# Patient Record
Sex: Female | Born: 1945
Health system: Southern US, Community
[De-identification: ages and names within clinical notes are randomized; demographics above are authoritative.]

---

## 1999-11-08 ENCOUNTER — Encounter: Payer: Self-pay | Admitting: Internal Medicine

## 1999-11-08 ENCOUNTER — Encounter: Admission: RE | Admit: 1999-11-08 | Discharge: 1999-11-08 | Payer: Self-pay | Admitting: Internal Medicine

## 2001-04-09 ENCOUNTER — Encounter: Payer: Self-pay | Admitting: Internal Medicine

## 2001-04-09 ENCOUNTER — Encounter: Admission: RE | Admit: 2001-04-09 | Discharge: 2001-04-09 | Payer: Self-pay | Admitting: Internal Medicine

## 2001-05-16 ENCOUNTER — Encounter: Payer: Self-pay | Admitting: Internal Medicine

## 2001-05-16 ENCOUNTER — Encounter: Admission: RE | Admit: 2001-05-16 | Discharge: 2001-05-16 | Payer: Self-pay | Admitting: Internal Medicine

## 2002-05-02 ENCOUNTER — Encounter: Admission: RE | Admit: 2002-05-02 | Discharge: 2002-05-02 | Payer: Self-pay | Admitting: Internal Medicine

## 2002-05-02 ENCOUNTER — Encounter: Payer: Self-pay | Admitting: Internal Medicine

## 2002-05-27 ENCOUNTER — Other Ambulatory Visit: Admission: RE | Admit: 2002-05-27 | Discharge: 2002-05-27 | Payer: Self-pay | Admitting: Obstetrics and Gynecology

## 2002-10-02 ENCOUNTER — Ambulatory Visit (HOSPITAL_COMMUNITY): Admission: RE | Admit: 2002-10-02 | Discharge: 2002-10-02 | Payer: Self-pay | Admitting: Internal Medicine

## 2002-10-02 ENCOUNTER — Encounter: Payer: Self-pay | Admitting: Internal Medicine

## 2003-06-08 ENCOUNTER — Inpatient Hospital Stay (HOSPITAL_COMMUNITY): Admission: RE | Admit: 2003-06-08 | Discharge: 2003-06-10 | Payer: Self-pay | Admitting: Obstetrics and Gynecology

## 2003-06-08 ENCOUNTER — Encounter (INDEPENDENT_AMBULATORY_CARE_PROVIDER_SITE_OTHER): Payer: Self-pay | Admitting: Specialist

## 2004-07-21 ENCOUNTER — Encounter: Admission: RE | Admit: 2004-07-21 | Discharge: 2004-07-21 | Payer: Self-pay | Admitting: Obstetrics and Gynecology

## 2006-06-01 ENCOUNTER — Encounter: Admission: RE | Admit: 2006-06-01 | Discharge: 2006-06-01 | Payer: Self-pay | Admitting: Internal Medicine

## 2006-06-14 ENCOUNTER — Encounter: Admission: RE | Admit: 2006-06-14 | Discharge: 2006-06-14 | Payer: Self-pay | Admitting: Internal Medicine

## 2006-06-27 ENCOUNTER — Encounter: Admission: RE | Admit: 2006-06-27 | Discharge: 2006-06-27 | Payer: Self-pay | Admitting: Obstetrics and Gynecology

## 2007-02-19 ENCOUNTER — Encounter: Admission: RE | Admit: 2007-02-19 | Discharge: 2007-02-19 | Payer: Self-pay | Admitting: Rheumatology

## 2007-03-25 ENCOUNTER — Encounter: Admission: RE | Admit: 2007-03-25 | Discharge: 2007-03-25 | Payer: Self-pay | Admitting: Internal Medicine

## 2008-03-25 ENCOUNTER — Encounter: Admission: RE | Admit: 2008-03-25 | Discharge: 2008-03-25 | Payer: Self-pay | Admitting: Internal Medicine

## 2009-03-18 ENCOUNTER — Encounter: Admission: RE | Admit: 2009-03-18 | Discharge: 2009-03-18 | Payer: Self-pay | Admitting: Internal Medicine

## 2009-12-31 ENCOUNTER — Encounter: Admission: RE | Admit: 2009-12-31 | Discharge: 2009-12-31 | Payer: Self-pay | Admitting: Internal Medicine

## 2010-01-01 ENCOUNTER — Inpatient Hospital Stay (HOSPITAL_COMMUNITY): Admission: EM | Admit: 2010-01-01 | Discharge: 2010-01-06 | Payer: Self-pay | Admitting: Emergency Medicine

## 2010-07-21 LAB — CLOSTRIDIUM DIFFICILE EIA: C difficile Toxins A+B, EIA: NEGATIVE

## 2010-07-22 LAB — BASIC METABOLIC PANEL
BUN: 15 mg/dL (ref 6–23)
BUN: 15 mg/dL (ref 6–23)
BUN: 4 mg/dL — ABNORMAL LOW (ref 6–23)
CO2: 25 mEq/L (ref 19–32)
CO2: 25 mEq/L (ref 19–32)
CO2: 26 mEq/L (ref 19–32)
Calcium: 9 mg/dL (ref 8.4–10.5)
Calcium: 9.3 mg/dL (ref 8.4–10.5)
Calcium: 9.7 mg/dL (ref 8.4–10.5)
Chloride: 100 mEq/L (ref 96–112)
Chloride: 112 mEq/L (ref 96–112)
Chloride: 96 mEq/L (ref 96–112)
Creatinine, Ser: 0.79 mg/dL (ref 0.4–1.2)
Creatinine, Ser: 0.96 mg/dL (ref 0.4–1.2)
Creatinine, Ser: 0.96 mg/dL (ref 0.4–1.2)
GFR calc Af Amer: >60 mL/min (ref >60)
GFR calc Af Amer: >60 mL/min (ref >60)
GFR calc Af Amer: >60 mL/min (ref >60)
GFR calc non Af Amer: 59 mL/min — ABNORMAL LOW (ref >60)
GFR calc non Af Amer: 59 mL/min — ABNORMAL LOW (ref >60)
GFR calc non Af Amer: >60 mL/min (ref >60)
Glucose, Bld: 106 mg/dL — ABNORMAL HIGH (ref 70–99)
Glucose, Bld: 112 mg/dL — ABNORMAL HIGH (ref 70–99)
Glucose, Bld: 120 mg/dL — ABNORMAL HIGH (ref 70–99)
Potassium: 3.3 mEq/L — ABNORMAL LOW (ref 3.5–5.1)
Potassium: 3.8 mEq/L (ref 3.5–5.1)
Potassium: 4.3 mEq/L (ref 3.5–5.1)
Sodium: 130 mEq/L — ABNORMAL LOW (ref 135–145)
Sodium: 136 mEq/L (ref 135–145)
Sodium: 143 mEq/L (ref 135–145)

## 2010-07-22 LAB — CBC
HCT: 33.8 % — ABNORMAL LOW (ref 36.0–46.0)
HCT: 35.4 % — ABNORMAL LOW (ref 36.0–46.0)
HCT: 38 % (ref 36.0–46.0)
HCT: 39.3 % (ref 36.0–46.0)
Hemoglobin: 11.5 g/dL — ABNORMAL LOW (ref 12.0–15.0)
Hemoglobin: 12.1 g/dL (ref 12.0–15.0)
Hemoglobin: 13.5 g/dL (ref 12.0–15.0)
Hemoglobin: 14.2 g/dL (ref 12.0–15.0)
MCH: 30.4 pg (ref 26.0–34.0)
MCH: 31.1 pg (ref 26.0–34.0)
MCH: 31.4 pg (ref 26.0–34.0)
MCH: 32.5 pg (ref 26.0–34.0)
MCHC: 34 g/dL (ref 30.0–36.0)
MCHC: 34.2 g/dL (ref 30.0–36.0)
MCHC: 35.5 g/dL (ref 30.0–36.0)
MCHC: 36.1 g/dL — ABNORMAL HIGH (ref 30.0–36.0)
MCV: 88.4 fL (ref 78.0–100.0)
MCV: 89.4 fL (ref 78.0–100.0)
MCV: 89.9 fL (ref 78.0–100.0)
MCV: 91 fL (ref 78.0–100.0)
Platelets: 263 10*3/uL (ref 150–400)
Platelets: 279 10*3/uL (ref 150–400)
Platelets: 351 10*3/uL (ref 150–400)
Platelets: 361 10*3/uL (ref 150–400)
RBC: 3.78 MIL/uL — ABNORMAL LOW (ref 3.87–5.11)
RBC: 3.89 MIL/uL (ref 3.87–5.11)
RBC: 4.3 MIL/uL (ref 3.87–5.11)
RBC: 4.37 MIL/uL (ref 3.87–5.11)
RDW: 12.4 % (ref 11.5–15.5)
RDW: 12.4 % (ref 11.5–15.5)
RDW: 12.4 % (ref 11.5–15.5)
RDW: 12.5 % (ref 11.5–15.5)
WBC: 12.2 10*3/uL — ABNORMAL HIGH (ref 4.0–10.5)
WBC: 13.5 10*3/uL — ABNORMAL HIGH (ref 4.0–10.5)
WBC: 8.1 10*3/uL (ref 4.0–10.5)
WBC: 8.3 10*3/uL (ref 4.0–10.5)

## 2010-07-22 LAB — CLOSTRIDIUM DIFFICILE EIA
C difficile Toxins A+B, EIA: NEGATIVE
C difficile Toxins A+B, EIA: NEGATIVE

## 2010-07-22 LAB — PROTIME-INR
INR: 1.02 (ref 0.00–1.49)
Prothrombin Time: 13.6 seconds (ref 11.6–15.2)

## 2010-07-22 LAB — COMPREHENSIVE METABOLIC PANEL
ALT: 24 U/L (ref 0–35)
AST: 30 U/L (ref 0–37)
Albumin: 2.9 g/dL — ABNORMAL LOW (ref 3.5–5.2)
Alkaline Phosphatase: 53 U/L (ref 39–117)
BUN: 2 mg/dL — ABNORMAL LOW (ref 6–23)
CO2: 24 mEq/L (ref 19–32)
Calcium: 8.5 mg/dL (ref 8.4–10.5)
Chloride: 113 mEq/L — ABNORMAL HIGH (ref 96–112)
Creatinine, Ser: 0.82 mg/dL (ref 0.4–1.2)
GFR calc Af Amer: >60 mL/min (ref >60)
GFR calc non Af Amer: >60 mL/min (ref >60)
Glucose, Bld: 100 mg/dL — ABNORMAL HIGH (ref 70–99)
Potassium: 4.5 mEq/L (ref 3.5–5.1)
Sodium: 140 mEq/L (ref 135–145)
Total Bilirubin: 0.7 mg/dL (ref 0.3–1.2)
Total Protein: 6 g/dL (ref 6.0–8.3)

## 2010-07-22 LAB — BODY FLUID CULTURE
Culture: NO GROWTH
Gram Stain: NONE SEEN

## 2010-07-22 LAB — DIFFERENTIAL
Basophils Absolute: 0.1 10*3/uL (ref 0.0–0.1)
Basophils Absolute: 0.1 10*3/uL (ref 0.0–0.1)
Basophils Relative: 0 % (ref 0–1)
Basophils Relative: 0 % (ref 0–1)
Eosinophils Absolute: 0 10*3/uL (ref 0.0–0.7)
Eosinophils Absolute: 0.1 10*3/uL (ref 0.0–0.7)
Eosinophils Relative: 0 % (ref 0–5)
Eosinophils Relative: 1 % (ref 0–5)
Lymphocytes Relative: 19 % (ref 12–46)
Lymphocytes Relative: 30 % (ref 12–46)
Lymphs Abs: 2.3 10*3/uL (ref 0.7–4.0)
Lymphs Abs: 4 10*3/uL (ref 0.7–4.0)
Monocytes Absolute: 0.9 10*3/uL (ref 0.1–1.0)
Monocytes Absolute: 1.1 10*3/uL — ABNORMAL HIGH (ref 0.1–1.0)
Monocytes Relative: 8 % (ref 3–12)
Monocytes Relative: 8 % (ref 3–12)
Neutro Abs: 8.2 10*3/uL — ABNORMAL HIGH (ref 1.7–7.7)
Neutro Abs: 8.9 10*3/uL — ABNORMAL HIGH (ref 1.7–7.7)
Neutrophils Relative %: 61 % (ref 43–77)
Neutrophils Relative %: 73 % (ref 43–77)

## 2010-09-23 NOTE — Discharge Summary (Signed)
NAMEELARA, COCKE                      ACCOUNT NO.:  [id number]   MEDICAL RECORD NO.:  [id number]                   PATIENT TYPE:  INP   LOCATION:  9306                                 FACILITY:  [facility]   PHYSICIAN:  Daniel L. Eda Paschal, M.D.           DATE OF BIRTH:  1945/12/20   DATE OF ADMISSION:  06/08/2003  DATE OF DISCHARGE:  06/10/2003                                 DISCHARGE SUMMARY   DISCHARGE DIAGNOSES:  1. Symptomatic uterine prolapse.  2. Cystocele.  3. Rectocele.  4. Dehiscence with vaginal hysterectomy, bilateral salpingo-oophorectomy,     anterior and posterior repair by Dr. Edyth Gunnels on June 08, 2003.   HISTORY OF PRESENT ILLNESS:  This 65 year old female gravida 2, para 2-0-1-  2, who had a 49-month history of increasing symptomatic pelvic relaxation  continues to feel pressure in her bladder.  She was found to have a third  degree cystocele, second degree rectocele and an almost third degree uterine  descensus.  Had been evaluated by Dr. Jonny Ruiz ___________ who did not feel a  sling procedure was necessary, so she entered the hospital for definitive  surgery.   HOSPITAL COURSE:  On June 08, 2003, the patient was admitted, underwent a  vaginal hysterectomy and bilateral salpingo-oophorectomy, anterior and  posterior repair by Dr. Edyth Gunnels without complications.  The patient  remained afebrile on postoperative day #1 on June 09, 2003.  Pack was  removed.  On June 10, 2003, Foley was removed.  She remained otherwise in  satisfactory condition.  On June 10, 2003, at 14:00, the patient has been  unable to void x2.  Foley had been replaced.  The patient was otherwise, in  satisfactory and stable condition, so she was discharged to home.   LABORATORY DATA:  Preoperatively on June 04, 2003, hemoglobin 12.4.  On  June 10, 2003, urine showed 20,000 enterococcus.   DISPOSITION:  The patient is discharged to home.  She was to  follow up on  Friday for Foley catheter removal.  She was discharged on Cipro 500 mg  b.i.d. and Vicodin p.r.n. pain.     Susa Loffler, P.A.                    Daniel L. Eda Paschal, M.D.    Ardath Sax  D:  07/13/2003  T:  07/13/2003  Job:  [id number]

## 2010-09-23 NOTE — H&P (Signed)
NAMESHIASIA, Sandra [name]                      ACCOUNT NO.:  [id number]   MEDICAL RECORD NO.:  [id number]                   PATIENT TYPE:  INP   LOCATION:  NA                                   [facility]  [facility]   PHYSICIAN:  Daniel L. Eda Paschal, M.D.           DATE OF BIRTH:  01/05/1946   DATE OF ADMISSION:  DATE OF DISCHARGE:                                HISTORY & PHYSICAL   CHIEF COMPLAINT:  Symptomatic pelvic relaxation.   HISTORY OF PRESENT ILLNESS:  The patient is a 65 year old gravida 3, para 2-  0-1-2, who comes into the hospital  after a 6 month history of increasing  symptomatic pelvic relaxation. The patient continuously  feels pressure on  her bladder. She also has a bulge which is always with her. It is difficult  to stand on her  feet for any period of time because of all this pressure. A  pelvic examination  reveals a 3rd degree cystocele, 2nd degree rectocele and  almost 3rd degree uterine descensus. She has been evaluated by Dr. Excell Seltzer.  Wrenn who did not feel a sling procedure was necessary, so she now enters  the hospital  for a vaginal hysterectomy and anterior and posterior  repair.  We have discussed the pros and cons of removing her ovaries, and she would  like them removed, even if it requires laparoscopy or laparotomy to do so,  so she will also undergo a salpingo oophorectomy bilaterally.   PAST MEDICAL HISTORY:  1. Gastroesophageal reflux disease.  2. Polymyalgia rheumatica.  3. Hypertension.  4. Previous surgery is a tubal ligation.   MEDICATIONS:  Lotensin, Prilosec and steroids for her polymyalgia  rheumatica.   FAMILY HISTORY:  Her brothers and sisters are hypertensive. Otherwise  her  family history is noncontributory.   SOCIAL HISTORY:  She is a nonsmoker, nondrinker.   REVIEW OF SYSTEMS:  HEENT:  Negative. CARDIAC:  Hypertension. RESPIRATORY:  Negative. GI:  GERD. MUSCULOSKELETAL:  Polymyalgia rheumatica. GU:  Large  cystocele. NEUROLOGIC,  PSYCHIATRIC:  Negative. ALLERGIC, IMMUNE, LYMPHATIC  AND  ENDOCRINE:  All negative.   PHYSICAL EXAMINATION:  GENERAL:  The patient is a well developed, well  nourished female in no acute distress.  VITAL SIGNS:  Blood pressure 140/90, pulse is 80 and regular, respirations  16 and nonlabored, afebrile.  HEENT:  Well within normal limits.  NECK:  Supple. Trachea is in the midline. Thyroid is  not enlarged.  LUNGS:  Clear percussion and auscultation.  HEART:  No thrills, heaves or murmurs.  BREASTS:  No masses.  ABDOMEN:  Soft without rebound or guarding or masses.  PELVIC:  External is normal, BUS is normal. Vaginal examination reveals 3rd  degree cystocele, 2nd degree rectocele. Cervix is clean. Uterus is  retroverted. Top normal size and shape with almost 3rd degree uterine  descensus. Adnexa failed to reveal masses.  RECTAL:  Negative.  EXTREMITIES:  Within normal limits.  IMPRESSION:  Symptomatic uterine prolapse, cystocele and rectocele.   PLAN:  Definitive surgery, see above.                                               Daniel L. Eda Paschal, M.D.    Tonette Bihari  D:  06/07/2003  T:  06/07/2003  Job:  [id number]

## 2010-09-23 NOTE — Op Note (Signed)
Sandra [name], ORMAND                      ACCOUNT NO.:  [id number]   MEDICAL RECORD NO.:  [id number]                   PATIENT TYPE:  INP   LOCATION:  9399                                 FACILITY:  [facility]   PHYSICIAN:  Daniel L. Eda Paschal, M.D.           DATE OF BIRTH:  03/10/1946   DATE OF PROCEDURE:  06/08/2003  DATE OF DISCHARGE:                                 OPERATIVE REPORT   PREOPERATIVE DIAGNOSES:  1. Symptomatic uterine prolapse.  2. Cystocele.  3. Rectocele.   POSTOPERATIVE DIAGNOSES:  1. Symptomatic uterine prolapse.  2. Cystocele.  3. Rectocele.   PROCEDURE:  Vaginal hysterectomy, bilateral salpingo-oophorectomy, anterior  and posterior repair.   SURGEON:  Daniel L. Eda Paschal, M.D.   FIRST ASSISTANT:  Timothy P. Fontaine, M.D.   FINDINGS:  The patient had almost third degree uterine descensus.  She had a  third degree cystocele and a second degree rectocele.  Ovaries, fallopian  tubes and pelvic peritoneum were free of disease.  Uterus itself was normal.   DESCRIPTION OF PROCEDURE:  After adequate general anesthesia, the patient  was placed in the dorsal lithotomy position, prepped and draped in the usual  sterile manner.  A 100 to 200,000 solution of epinephrine and 0.5% Xylocaine  was injected around the cervix.  A 360 degree incision was made around the  cervix.  The bladder was mobilized superiorly as was the posterior  peritoneum.  The posterior peritoneum and vesicouterine fold of peritoneum  were entered with sharp dissection.  The uterosacral ligaments were clamped.  In clamping them, they were shortened and they were sutured to the vault  laterally with good vault support.  Cardinal ligaments, uterine arteries,  and the balance of the broad ligament were clamped, cut, and suture ligated.  The ovaries and tubes were delivered and both IP ligaments were clamped,  cut, and suture ligated.  Suture material for all of the above mentioned  pedicles was  #1 chromic catgut.  All major vesicle bundles were doubly  ligated.  Uterus, ovaries and tubes were sent to pathology for tissue  diagnosis.  The vaginal cuff was whip stitched with a running locking 0  Vicryl.  A modified McCall enterocele suture was placed with 2-0 Vicryl.  At  this point, the cuff was left open and the anterior repair was done.  The  anterior vaginal mucosa was undermined up to the urethrovesical angle and  the cystocele and the vesicle vaginal fascia was sharply dissected free from  the mucosa.  A Foley catheter was inserted and then the cystocele was  eliminated with about eight sutures of 2-0 Vicryl picking up vesical vaginal  fascia.  Redundant patch of mucosa was trimmed away and then the anterior  vaginal mucosa was closed with a running locking 2-0 Vicryl, also picking up  the fascia underneath for better support as well as to eliminate dead space.  After this, the cuff was closed with figure-of-eights  and #1 chromic catgut.  The modified McCall enterocele sutures were tied in place and then attention  was turned to the posterior repair. The posterior vaginal mucosa was  undermined to the top of the cuff.  The perirectal fascia and the rectocele  were sharply dissected free.  The rectocele was then reduced with  interrupted 2-0 Vicryl picking up perirectal fascia.  Redundant vaginal  mucosa was trimmed away and  then the vaginal mucosa was closed with a running locking 2-0 Vicryl.  The  vagina was packed with one inch Iodoform.  The estimated blood loss for the  entire procedure was 300 mL with none replaced.  The patient tolerated the  procedure well and left the operating room in satisfactory condition  draining clear urine from her Foley catheter.                                               Daniel L. Eda Paschal, M.D.    Tonette Bihari  D:  06/08/2003  T:  06/08/2003  Job:  [id number]

## 2011-08-14 ENCOUNTER — Other Ambulatory Visit: Payer: Self-pay | Admitting: Internal Medicine

## 2011-08-14 DIAGNOSIS — Z1231 Encounter for screening mammogram for malignant neoplasm of breast: Secondary | ICD-10-CM

## 2011-08-24 ENCOUNTER — Ambulatory Visit
Admission: RE | Admit: 2011-08-24 | Discharge: 2011-08-24 | Disposition: A | Discharge status: Home / Self Care | Payer: BC Managed Care – PPO | Source: Ambulatory Visit | Attending: Internal Medicine | Admitting: Internal Medicine

## 2011-08-24 DIAGNOSIS — Z1231 Encounter for screening mammogram for malignant neoplasm of breast: Secondary | ICD-10-CM

## 2011-11-08 ENCOUNTER — Encounter: Payer: Self-pay | Admitting: Sports Medicine

## 2011-11-08 ENCOUNTER — Ambulatory Visit (INDEPENDENT_AMBULATORY_CARE_PROVIDER_SITE_OTHER): Payer: BC Managed Care – PPO | Admitting: Sports Medicine

## 2011-11-08 VITALS — BP 130/76 | HR 68 | Ht 62.0 in | Wt 160.0 lb

## 2011-11-08 DIAGNOSIS — M25569 Pain in unspecified knee: Secondary | ICD-10-CM | POA: Insufficient documentation

## 2011-11-08 NOTE — Progress Notes (Signed)
  Subjective:    Patient ID: Sandra [name], female    DOB: [birth year], 66 y.o.   MRN: [id number]  HPI Patient is a very pleasant 66 year old female comes in today complaining of 1 month of left knee pain. Symptoms began while walking. She felt the knee "shift" followed by pain and swelling. She saw her primary care physician who ordered x-rays of her knee and placed her on Indocin but despite that her symptoms persist. She describes a sharp pain along the medial aspect of the knee, at times radiating into the os tear aspect of the knee. Pain is worse with weightbearing and walking (she is an avid recreational walker). No mechanical symptoms. Symptoms resolved at rest. No problems with her knee in the past, no prior knee surgeries. She is seen today at the request of Dr. Kevan Ny for further evaluation and treatment.   Review of Systems     Objective:   Physical Exam  Well-developed and well-nourished, in no acute distress. Is elevation of the left knee shows a 5 extension lag with flexion to about 110. She is tender to palpation along the medial joint line, negative McMurray's, positive thesalys. Knee is stable to valgus and varus stressing negative anterior drawer negative posterior drawer. Trace patellofemoral crepitus with active extension. She is neurovascularly intact distally and walking with a slight limp.  X-rays from Dr. Kevan Ny his office are unfortunately not available for review. X-rays from October of 2008 are reviewed and they show a paucity of degenerative changes. Please note that these films are nonweightbearing.       Assessment & Plan:  #1. Left knee pain likely secondary to degenerative medial meniscal tear  Patient's knee is injected in the office today. He was draped and prepped in a sterile fashion. Injected with a combination of 1 cc of Depo-Medrol/3 cc of Marcaine. This was accomplished using an anterior lateral approach after risks and benefits were explained. The  patient tolerated this without any difficulty. She'll try a body she leaks knee sleeve and given her home exercise program. She can continue her walking program as long as pain is less than 3/10 and she is not limping. She'll followup in 3 weeks at which time a basket she bring her most recent x-rays. If symptoms persist a followup we will likely need to consider merits of further diagnostic imaging.

## 2011-11-22 ENCOUNTER — Other Ambulatory Visit: Payer: Self-pay | Admitting: Orthopaedic Surgery

## 2011-11-22 DIAGNOSIS — M25562 Pain in left knee: Secondary | ICD-10-CM

## 2011-11-30 ENCOUNTER — Ambulatory Visit: Payer: BC Managed Care – PPO | Admitting: Sports Medicine

## 2011-12-01 ENCOUNTER — Ambulatory Visit
Admission: RE | Admit: 2011-12-01 | Discharge: 2011-12-01 | Disposition: A | Discharge status: Home / Self Care | Payer: BC Managed Care – PPO | Source: Ambulatory Visit | Attending: Orthopaedic Surgery | Admitting: Orthopaedic Surgery

## 2011-12-01 ENCOUNTER — Ambulatory Visit: Payer: BC Managed Care – PPO | Admitting: Sports Medicine

## 2011-12-01 DIAGNOSIS — M25562 Pain in left knee: Secondary | ICD-10-CM

## 2013-03-14 ENCOUNTER — Other Ambulatory Visit: Payer: Self-pay

## 2013-03-14 DIAGNOSIS — Z1231 Encounter for screening mammogram for malignant neoplasm of breast: Secondary | ICD-10-CM

## 2013-04-11 ENCOUNTER — Ambulatory Visit
Admission: RE | Admit: 2013-04-11 | Discharge: 2013-04-11 | Disposition: A | Discharge status: Home / Self Care | Payer: BC Managed Care – PPO | Source: Ambulatory Visit

## 2013-04-11 DIAGNOSIS — Z1231 Encounter for screening mammogram for malignant neoplasm of breast: Secondary | ICD-10-CM

## 2014-03-06 ENCOUNTER — Other Ambulatory Visit: Payer: Self-pay | Admitting: Gastroenterology

## 2014-07-20 ENCOUNTER — Other Ambulatory Visit: Payer: Self-pay | Admitting: Internal Medicine

## 2014-07-20 DIAGNOSIS — K5732 Diverticulitis of large intestine without perforation or abscess without bleeding: Secondary | ICD-10-CM

## 2014-07-21 ENCOUNTER — Ambulatory Visit
Admission: RE | Admit: 2014-07-21 | Discharge: 2014-07-21 | Disposition: A | Discharge status: Home / Self Care | Payer: BC Managed Care – PPO | Source: Ambulatory Visit | Attending: Internal Medicine | Admitting: Internal Medicine

## 2014-07-21 ENCOUNTER — Other Ambulatory Visit: Payer: BC Managed Care – PPO

## 2014-07-21 DIAGNOSIS — K5732 Diverticulitis of large intestine without perforation or abscess without bleeding: Secondary | ICD-10-CM

## 2014-07-21 MED ORDER — IOPAMIDOL (ISOVUE-300) INJECTION 61%
100.0000 mL | Freq: Once | INTRAVENOUS | Status: AC | PRN
  Administered 2014-07-21: 100 mL via INTRAVENOUS

## 2015-05-17 ENCOUNTER — Other Ambulatory Visit: Payer: Self-pay

## 2015-05-17 DIAGNOSIS — Z1231 Encounter for screening mammogram for malignant neoplasm of breast: Secondary | ICD-10-CM

## 2015-06-07 ENCOUNTER — Ambulatory Visit
Admission: RE | Admit: 2015-06-07 | Discharge: 2015-06-07 | Disposition: A | Discharge status: Home / Self Care | Payer: Medicare Other | Source: Ambulatory Visit

## 2015-06-07 DIAGNOSIS — Z1231 Encounter for screening mammogram for malignant neoplasm of breast: Secondary | ICD-10-CM

## 2015-12-15 ENCOUNTER — Ambulatory Visit (INDEPENDENT_AMBULATORY_CARE_PROVIDER_SITE_OTHER): Payer: Medicare Other | Admitting: Internal Medicine

## 2015-12-15 ENCOUNTER — Encounter: Payer: Self-pay | Admitting: Internal Medicine

## 2015-12-15 DIAGNOSIS — K219 Gastro-esophageal reflux disease without esophagitis: Secondary | ICD-10-CM | POA: Insufficient documentation

## 2015-12-15 DIAGNOSIS — M542 Cervicalgia: Secondary | ICD-10-CM | POA: Insufficient documentation

## 2015-12-15 DIAGNOSIS — K5732 Diverticulitis of large intestine without perforation or abscess without bleeding: Secondary | ICD-10-CM | POA: Insufficient documentation

## 2015-12-15 DIAGNOSIS — I10 Essential (primary) hypertension: Secondary | ICD-10-CM | POA: Insufficient documentation

## 2015-12-15 DIAGNOSIS — E1165 Type 2 diabetes mellitus with hyperglycemia: Secondary | ICD-10-CM | POA: Diagnosis not present

## 2015-12-15 DIAGNOSIS — E538 Deficiency of other specified B group vitamins: Secondary | ICD-10-CM | POA: Insufficient documentation

## 2015-12-15 DIAGNOSIS — N63 Unspecified lump in unspecified breast: Secondary | ICD-10-CM | POA: Insufficient documentation

## 2015-12-15 LAB — POCT GLYCOSYLATED HEMOGLOBIN (HGB A1C): Hemoglobin A1C: 7.7

## 2015-12-15 MED ORDER — METFORMIN HCL ER 500 MG PO TB24: 500.0000 mg | ORAL_TABLET | Freq: Every day | ORAL | 3 refills | Status: DC

## 2015-12-15 NOTE — Patient Instructions (Addendum)
Please continue Januvia 100 mg but move it to before b'fast.  Please start Metformin ER 500 mg daily with dinner.  Please let me know if the sugars are consistently <80 or >200.  PATIENT INSTRUCTIONS FOR TYPE 2 DIABETES:  **Please join MyChart!** - see attached instructions about how to join if you have not done so already.  DIET AND EXERCISE Diet and exercise is an important part of diabetic treatment.  We recommended aerobic exercise in the form of brisk walking (working between 40-60% of maximal aerobic capacity, similar to brisk walking) for 150 minutes per week (such as 30 minutes five days per week) along with 3 times per week performing 'resistance' training (using various gauge rubber tubes with handles) 5-10 exercises involving the major muscle groups (upper body, lower body and core) performing 10-15 repetitions (or near fatigue) each exercise. Start at half the above goal but build slowly to reach the above goals. If limited by weight, joint pain, or disability, we recommend daily walking in a swimming pool with water up to waist to reduce pressure from joints while allow for adequate exercise.    BLOOD GLUCOSES Monitoring your blood glucoses is important for continued management of your diabetes. Please check your blood glucoses 2-4 times a day: fasting, before meals and at bedtime (you can rotate these measurements - e.g. one day check before the 3 meals, the next day check before 2 of the meals and before bedtime, etc.).   HYPOGLYCEMIA (low blood sugar) Hypoglycemia is usually a reaction to not eating, exercising, or taking too much insulin/ other diabetes drugs.  Symptoms include tremors, sweating, hunger, confusion, headache, etc. Treat IMMEDIATELY with 15 grams of Carbs: . 4 glucose tablets .  cup regular juice/soda . 2 tablespoons raisins . 4 teaspoons sugar . 1 tablespoon honey Recheck blood glucose in 15 mins and repeat above if still symptomatic/blood glucose  <100.  RECOMMENDATIONS TO REDUCE YOUR RISK OF DIABETIC COMPLICATIONS: * Take your prescribed MEDICATION(S) * Follow a DIABETIC diet: Complex carbs, fiber rich foods, (monounsaturated and polyunsaturated) fats * AVOID saturated/trans fats, high fat foods, >2,300 mg salt per day. * EXERCISE at least 5 times a week for 30 minutes or preferably daily.  * DO NOT SMOKE OR DRINK more than 1 drink a day. * Check your FEET every day. Do not wear tightfitting shoes. Contact us if you develop an ulcer * See your EYE doctor once a year or more if needed * Get a FLU shot once a year * Get a PNEUMONIA vaccine once before and once after age 565 years  GOALS:  * Your Hemoglobin A1c of <7%  * fasting sugars need to be <130 * after meals sugars need to be <180 (2h after you start eating) * Your Systolic BP should be 140 or lower  * Your Diastolic BP should be 80 or lower  * Your HDL (Good Cholesterol) should be 40 or higher  * Your LDL (Bad Cholesterol) should be 100 or lower. * Your Triglycerides should be 150 or lower  * Your Urine microalbumin (kidney function) should be <30 * Your Body Mass Index should be 25 or lower    Please consider the following ways to cut down carbs and fat and increase fiber and micronutrients in your diet: - substitute whole grain for white bread or pasta - substitute brown rice for white rice - substitute 90-calorie flat bread pieces for slices of bread when possible - substitute sweet potatoes or yams for white  potatoes - substitute humus for margarine - substitute tofu for cheese when possible - substitute almond or rice milk for regular milk (would not drink soy milk daily due to concern for soy estrogen influence on breast cancer risk) - substitute dark chocolate for other sweets when possible - substitute water - can add lemon or orange slices for taste - for diet sodas (artificial sweeteners will trick your body that you can eat sweets without getting calories and  will lead you to overeating and weight gain in the long run) - do not skip breakfast or other meals (this will slow down the metabolism and will result in more weight gain over time)  - can try smoothies made from fruit and almond/rice milk in am instead of regular breakfast - can also try old-fashioned (not instant) oatmeal made with almond/rice milk in am - order the dressing on the side when eating salad at a restaurant (pour less than half of the dressing on the salad) - eat as little meat as possible - can try juicing, but should not forget that juicing will get rid of the fiber, so would alternate with eating raw veg./fruits or drinking smoothies - use as little oil as possible, even when using olive oil - can dress a salad with a mix of balsamic vinegar and lemon juice, for e.g. - use agave nectar, stevia sugar, or regular sugar rather than artificial sweateners - steam or broil/roast veggies  - snack on veggies/fruit/nuts (unsalted, preferably) when possible, rather than processed foods - reduce or eliminate aspartame in diet (it is in diet sodas, chewing gum, etc) Read the labels!  Try to read Dr. Katherina Right book: "Program for Reversing Diabetes" for other ideas for healthy eating.

## 2015-12-15 NOTE — Progress Notes (Signed)
Patient ID: Sandra [name], female   DOB: Nov 06, 1945, 70 y.o.   MRN: [id number]   HPI: Sandra [name] is a 70 y.o.-year-old female, referred by her PCP, Dr. Kevan Ny, for management of DM2, dx in 2015, prev. Prediabetes, non-insulin-dependent, uncontrolled, without complications.  Last hemoglobin A1c was: 10/2015: HbA1c 8.2%  Pt is on a regimen of: - Januvia 100 mg daily before dinner - started 11/2015 She was on Metformin 500 mg 1x a day with dinner (500 mg tid >> AP/diarrhea)  >> stopped when started Januvia  Pt checks her sugars 0-1x a day and they are: - am: n/c  - 2h after b'fast: n/c - before lunch: n/c - 2h after lunch: 180s - before dinner: n/c - 2h after dinner: n/c - bedtime: n/c - nighttime: n/c No lows; ? hypoglycemia awareness.  Glucometer: OneTouch  Pt's meals are (she started to cut down sugars): - Breakfast: - Lunch: - Dinner:  - Snacks:  She started to walk 3x a week, 2-3 miles a day.  - no CKD, last BUN/creatinine:  10/22/2015: 21/0.79, ACR 35.6 Lab Results  Component Value Date   BUN 2 (L) 01/04/2010   BUN 4 (L) 01/03/2010   CREATININE 0.82 01/04/2010   CREATININE 0.79 01/03/2010   - last set of lipids: 10/22/2015: 226/201/48/138 No results found for: CHOL, HDL, LDLCALC, LDLDIRECT, TRIG, CHOLHDL - last eye exam was in 04/2015. No DR.  - no numbness and tingling in her feet. Last foot exam was normal on 10/22/2015.  Pt has FH of DM in 5 siblings.  ROS: Constitutional: + weight gain, no fatigue, no subjective hyperthermia/hypothermia Eyes: no blurry vision, no xerophthalmia ENT: no sore throat, no nodules palpated in throat, no dysphagia/odynophagia, no hoarseness, + decreased hearing Cardiovascular: no CP/SOB/palpitations/leg swelling Respiratory: no cough/SOB Gastrointestinal: no N/V/D/C/+ heartburn Musculoskeletal: no muscle/joint aches Skin: no rashes Neurological: no tremors/numbness/tingling/dizziness Psychiatric: no  depression/anxiety  Past medical history: Patient Active Problem List   Diagnosis Date Noted  . 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 Cervical pain 12/15/2015  . Diverticulitis of colon 12/15/2015  . Acid reflux 12/15/2015  . Essential (primary) hypertension 12/15/2015  . Benign breast lumps 12/15/2015  . Type 2 diabetes mellitus with hyperglycemia (HCC) 12/15/2015  . Knee pain 11/08/2011   No past surgical history on file.   Social History   Social History  . Marital status: Married    Spouse name: N/A  . Number of children: 2   Occupational History  . Retired    Social History Main Topics  . Smoking status: Never Smoker  . Smokeless tobacco: Never Used  . Alcohol use Socially   . Drug use: No     Dose, Frequency                ONETOUCH VERIO test strip             Note (12/15/2015): Received from: External Pharmacy         [facility] LANCETS 33G MISC             Note (12/15/2015): Received from: External Pharmacy         omeprazole (PRILOSEC) 20 MG capsule 20 mg, Daily       Summary: Take 20 mg by mouth daily.     MICARDIS HCT 80-25 MG per tablet 1 tablet, Daily       Summary: Take 1 tablet by mouth daily.     JANUVIA 100 MG tablet  Note (12/15/2015): Received from: External Pharmacy           Allergies  Allergen Reactions  . Sulfa Antibiotics Rash   No family history on file.  PE: BP (!) 144/90 (BP Location: Left Arm, Patient Position: Sitting)   Pulse 75   Ht 5' 2.5" (1.588 m)   Wt 152 lb (68.9 kg)   BMI 27.36 kg/m  Wt Readings from Last 3 Encounters:  12/15/15 152 lb (68.9 kg)  11/08/11 160 lb (72.6 kg)   Constitutional: Slightly overweight, in NAD Eyes: PERRLA, EOMI, no exophthalmos ENT: moist mucous membranes, no thyromegaly, no cervical lymphadenopathy Cardiovascular: RRR, No MRG Respiratory: CTA B Gastrointestinal: abdomen soft, NT, ND, BS+ Musculoskeletal: no deformities, strength intact in all 4 Skin:  moist, warm, no rashes Neurological: no tremor with outstretched hands, DTR normal in all 4  ASSESSMENT: 1. DM2, non-insulin-dependent, uncontrolled, without long term complications, but with hyperglycemia  PLAN:  1. Patient with long-standing, uncontrolled diabetes, on oral antidiabetic regimen, with Januvia only, which is taken incorrectly, before dinner. Misunderstood to stop the metformin when she started Januvia. She could not tolerate 500 mg 3 times a day of metformin, but did well with 500 mg with dinner. - At this visit, I suggested to continue Januvia but move it before breakfast and also add 500 mg of metformin extended-release with dinner -We rechecked her HbA1c today and this was lower, at 7.7%  - I suggested to:  Patient Instructions  Please continue Januvia 100 mg but move it to before b'fast.  Please start Metformin ER 500 mg daily with dinner.  Please let me know if the sugars are consistently <80 or >200.  - Strongly advised her to start checking sugars at different times of the day - check 2 times a day, rotating checks - given sugar log and advised how to fill it and to bring it at next appt  - given foot care handout and explained the principles  - given instructions for hypoglycemia management "15-15 rule"  - advised for yearly eye exams  - Return to clinic in 1.5 mo with sugar log   Carlus Pavlovristina Zonie Crutcher, MD PhD [facility]

## 2015-12-15 NOTE — Addendum Note (Signed)
Addended by: Darene LamerHOMPSON, Amirra Herling T on: 12/15/2015 01:11 PM   Modules accepted: Orders

## 2015-12-22 ENCOUNTER — Telehealth: Payer: Self-pay | Admitting: Internal Medicine

## 2015-12-22 NOTE — Telephone Encounter (Signed)
Patient stated that she

## 2016-02-08 ENCOUNTER — Ambulatory Visit (INDEPENDENT_AMBULATORY_CARE_PROVIDER_SITE_OTHER): Payer: Medicare Other | Admitting: Internal Medicine

## 2016-02-08 ENCOUNTER — Encounter: Payer: Self-pay | Admitting: Internal Medicine

## 2016-02-08 VITALS — BP 122/88 | HR 86 | Ht 62.5 in | Wt 153.0 lb

## 2016-02-08 DIAGNOSIS — E1165 Type 2 diabetes mellitus with hyperglycemia: Secondary | ICD-10-CM

## 2016-02-08 NOTE — Progress Notes (Signed)
Patient ID: Sandra [name], female   DOB: 1945/08/06, 70 y.o.   MRN: [id number]   HPI: Sandra [name] is a 70 y.o.-year-old female, returning for f/u for DM2, dx in 2015, prev. Prediabetes, non-insulin-dependent, uncontrolled, without complications. Last visit 2 mo ago.  Last hemoglobin A1c was: Lab Results  Component Value Date   HGBA1C 7.7 12/15/2015  10/2015: HbA1c 8.2%  Pt is on a regimen of: - Januvia 100 mg before b'fast. - Prev. Before dinner, started 11/2015 - Metformin ER 500 mg daily with dinner. She was on Metformin 500 mg 1x a day with dinner (500 mg tid >> AP/diarrhea)  >> stopped when started Januvia  Pt checks her sugars 0-1x a day and they are: - am: n/c >> 133-176 - 2h after b'fast: n/c - before lunch: n/c >> 178 - 2h after lunch: 180s >> 153-169, 192 - before dinner: n/c >> 130-162 - 2h after dinner: n/c - bedtime: n/c >> 203, 233 - nighttime: n/c No lows; ? hypoglycemia awareness.  Glucometer: OneTouch  Pt's meals are (she started to cut down sugars):  She started to walk 3x a week, 2-3 miles a day. She will go to [employer].  - no CKD, last BUN/creatinine:  10/22/2015: 21/0.79, ACR 35.6 Lab Results  Component Value Date   BUN 2 (L) 01/04/2010   BUN 4 (L) 01/03/2010   CREATININE 0.82 01/04/2010   CREATININE 0.79 01/03/2010   - last set of lipids: 10/22/2015: 226/201/48/138 No results found for: CHOL, HDL, LDLCALC, LDLDIRECT, TRIG, CHOLHDL - last eye exam was in 04/2015. No DR.  - no numbness and tingling in her feet. Last foot exam was normal on 10/22/2015.  ROS: Constitutional: no weight gain, no fatigue, no subjective hyperthermia/hypothermia Eyes: no blurry vision, no xerophthalmia ENT: no sore throat, no nodules palpated in throat, no dysphagia/odynophagia, no hoarseness, + decreased hearing Cardiovascular: no CP/SOB/palpitations/leg swelling Respiratory: no cough/SOB Gastrointestinal: no N/V/D/C/heartburn Musculoskeletal: no  muscle/joint aches Skin: no rashes Neurological: no tremors/numbness/tingling/dizziness  Past medical history: Patient Active Problem List   Diagnosis Date Noted  . Type 2 diabetes mellitus with hyperglycemia (HCC) 12/15/2015    Priority: High  . 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 Cervical pain 12/15/2015  . Diverticulitis of colon 12/15/2015  . Acid reflux 12/15/2015  . Essential (primary) hypertension 12/15/2015  . Benign breast lumps 12/15/2015  . Knee pain 11/08/2011   No past surgical history on file.   Social History   Social History  . Marital status: Married    Spouse name: N/A  . Number of children: 2   Occupational History  . Retired    Social History Main Topics  . Smoking status: Never Smoker  . Smokeless tobacco: Never Used  . Alcohol use Socially   . Drug use: No     Dose, Frequency                ONETOUCH VERIO test strip             Note (12/15/2015): Received from: External Pharmacy         [facility]             Note (12/15/2015): Received from: External Pharmacy         omeprazole (PRILOSEC) 20 MG capsule 20 mg, Daily       Summary: Take 20 mg by mouth daily.     MICARDIS HCT 80-25 MG per tablet 1 tablet, Daily  Summary: Take 1 tablet by mouth daily.     JANUVIA 100 MG tablet             Note (12/15/2015): Received from: External Pharmacy           Allergies  Allergen Reactions  . Sulfa Antibiotics Rash   No family history on file.  PE: BP 122/88 (BP Location: Left Arm, Patient Position: Sitting)   Pulse 86   Ht 5' 2.5" (1.588 m)   Wt 153 lb (69.4 kg)   SpO2 97%   BMI 27.54 kg/m  Wt Readings from Last 3 Encounters:  02/08/16 153 lb (69.4 kg)  12/15/15 152 lb (68.9 kg)  11/08/11 160 lb (72.6 kg)   Constitutional: Slightly overweight, in NAD Eyes: PERRLA, EOMI, no exophthalmos ENT: moist mucous membranes, no thyromegaly, no cervical lymphadenopathy Cardiovascular: RRR, No  MRG Respiratory: CTA B Gastrointestinal: abdomen soft, NT, ND, BS+ Musculoskeletal: no deformities, strength intact in all 4 Skin: moist, warm, no rashes Neurological: no tremor with outstretched hands, DTR normal in all 4  ASSESSMENT: 1. DM2, non-insulin-dependent, uncontrolled, without long term complications, but with hyperglycemia  PLAN:  1. Patient with long-standing, uncontrolled diabetes, on oral antidiabetic regimen, with Januvia and now back on Metformin (ER this time). She could not tolerate regular metformin 500 mg 3 times a day of metformin, but did well with 500 mg with dinner. She is only on 500 mg Metformin ER with dinner >> will try to increase to 2x a day. -We rechecked her HbA1c at last visit and this was lower, at 7.7%  - I suggested to:   Patient Instructions  Please continue: - Januvia 100 mg before b'fast. - Metformin ER 500 mg daily with dinner.  Increase exercise as tolerated.  Increase Metformin ER 500 mg to 2x a day when you come back from [place].  Please call and schedule an eye appt with Dr. Randon Goldsmith: [facility]:  Dr. Jeni Salles MD ?  Address: [address] [place], [place], [place] [address]  [phone]  Please return in 2 months with your sugar log.   - continuechecking sugars at different times of the day - check 2 times a day, rotating checks - advised for yearly eye exams  - Return to clinic in 3 mo with sugar log   Carlus Pavlov, MD PhD [facility] Endocrinology

## 2016-02-08 NOTE — Patient Instructions (Addendum)
Patient Instructions  Please continue: - Januvia 100 mg before b'fast. - Metformin ER 500 mg daily with dinner.  Increase exercise as tolerated.  Increase Metformin ER 500 mg to 2x a day when you come back from [place].  Please call and schedule an eye appt with Dr. Randon GoldsmithLyles: [facility]:  Dr. Jeni SallesGraham W. Lyles MD ?  Address: [address], [place], [place] [address]  [phone]  Please return in 2 months with your sugar log.

## 2016-04-10 ENCOUNTER — Encounter: Payer: Self-pay | Admitting: Internal Medicine

## 2016-04-10 ENCOUNTER — Ambulatory Visit (INDEPENDENT_AMBULATORY_CARE_PROVIDER_SITE_OTHER): Payer: Medicare Other | Admitting: Internal Medicine

## 2016-04-10 VITALS — BP 140/76 | HR 82 | Wt 150.0 lb

## 2016-04-10 DIAGNOSIS — E1165 Type 2 diabetes mellitus with hyperglycemia: Secondary | ICD-10-CM

## 2016-04-10 LAB — POCT GLYCOSYLATED HEMOGLOBIN (HGB A1C): Hemoglobin A1C: 7.3

## 2016-04-10 MED ORDER — JANUVIA 100 MG PO TABS: 100.0000 mg | ORAL_TABLET | Freq: Every day | ORAL | 3 refills | Status: DC

## 2016-04-10 MED ORDER — METFORMIN HCL ER 500 MG PO TB24: 500.0000 mg | ORAL_TABLET | Freq: Two times a day (BID) | ORAL | 3 refills | Status: DC

## 2016-04-10 NOTE — Progress Notes (Signed)
Patient ID: Sandra [name], female   DOB: 1946/02/27, 70 y.o.   MRN: [id number]   HPI: Sandra [name] is a 70 y.o.-year-old female, returning for f/u for DM2, dx in 2015, prev. Prediabetes, non-insulin-dependent, uncontrolled, without complications. Last visit 4 mo ago.  She is walking more. She also cut down portions. She snacks at night.  Last hemoglobin A1c was: Lab Results  Component Value Date   HGBA1C 7.7 12/15/2015  10/2015: HbA1c 8.2%  Pt is on a regimen of: - Januvia 100 mg before b'fast. - Prev. Before dinner, started 11/2015 - Metformin ER 500 mg daily with dinner She was on Metformin 500 mg 1x a day with dinner (500 mg tid >> AP/diarrhea)  >> stopped when started Januvia  Pt checks her sugars 0-1x a day and they are: - am: n/c >> 133-176 >> 121-193 - 2h after b'fast: n/c - before lunch: n/c >> 178 >> n/c - 2h after lunch: 180s >> 153-169, 192 >> 151 - before dinner: n/c >> 130-162 >> 128-165 - 2h after dinner: n/c >> 137 - bedtime: n/c >> 203, 233 >> 127 - nighttime: n/c >> 169-208 No lows; ? hypoglycemia awareness.  Glucometer: OneTouch  She is walk 3x a week, 2-3 miles a day. She will go to [employer].  - no CKD, last BUN/creatinine:  10/22/2015: 21/0.79, ACR 35.6 Lab Results  Component Value Date   BUN 2 (L) 01/04/2010   BUN 4 (L) 01/03/2010   CREATININE 0.82 01/04/2010   CREATININE 0.79 01/03/2010   - last set of lipids: 10/22/2015: 226/201/48/138 No results found for: CHOL, HDL, LDLCALC, LDLDIRECT, TRIG, CHOLHDL - last eye exam was in 04/2015. No DR.  - no numbness and tingling in her feet. Last foot exam was normal on 10/22/2015.  ROS: Constitutional: no weight gain, no fatigue, no subjective hyperthermia/hypothermia Eyes: no blurry vision, no xerophthalmia ENT: no sore throat, no nodules palpated in throat, no dysphagia/odynophagia, no hoarseness Cardiovascular: no CP/SOB/palpitations/leg swelling Respiratory: no  cough/SOB Gastrointestinal: no N/V/D/C/heartburn Musculoskeletal: no muscle/joint aches Skin: no rashes Neurological: no tremors/numbness/tingling/dizziness  Past medical history: Patient Active Problem List   Diagnosis Date Noted  . Type 2 diabetes mellitus with hyperglycemia (HCC) 12/15/2015    Priority: High  . 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 Cervical pain 12/15/2015  . Diverticulitis of colon 12/15/2015  . Acid reflux 12/15/2015  . Essential (primary) hypertension 12/15/2015  . Benign breast lumps 12/15/2015  . Knee pain 11/08/2011   No past surgical history on file.   Social History   Social History  . Marital status: Married    Spouse name: N/A  . Number of children: 2   Occupational History  . Retired    Social History Main Topics  . Smoking status: Never Smoker  . Smokeless tobacco: Never Used  . Alcohol use Socially   . Drug use: No    Current Outpatient Prescriptions on File Prior to Visit  Medication Sig Dispense Refill  . atorvastatin (LIPITOR) 10 MG tablet     . JANUVIA 100 MG tablet     . metFORMIN (GLUCOPHAGE-XR) 500 MG 24 hr tablet Take 1 tablet (500 mg total) by mouth daily with supper. 90 tablet 3  . MICARDIS HCT 80-25 MG per tablet Take 1 tablet by mouth daily.    Marland Kitchen. omeprazole (PRILOSEC) 20 MG capsule Take 20 mg by mouth daily.    Letta Pate. ONETOUCH DELICA LANCETS 33G MISC     . ONETOUCH VERIO test strip  No current facility-administered medications on file prior to visit.     Allergies  Allergen Reactions  . Sulfa Antibiotics Rash   No family history on file.  PE: BP 140/76   Pulse 82   Wt 150 lb (68 kg)   BMI 27.00 kg/m  Wt Readings from Last 3 Encounters:  04/10/16 150 lb (68 kg)  02/08/16 153 lb (69.4 kg)  12/15/15 152 lb (68.9 kg)   Constitutional: Slightly overweight, in NAD Eyes: PERRLA, EOMI, no exophthalmos ENT: moist mucous membranes, no thyromegaly, no cervical lymphadenopathy Cardiovascular: RRR, No  MRG Respiratory: CTA B Gastrointestinal: abdomen soft, NT, ND, BS+ Musculoskeletal: no deformities, strength intact in all 4 Skin: moist, warm, no rashes Neurological: no tremor with outstretched hands, DTR normal in all 4  ASSESSMENT: 1. DM2, non-insulin-dependent, uncontrolled, without long term complications, but with hyperglycemia  PLAN:  1. Patient with long-standing, uncontrolled diabetes, on oral antidiabetic regimen, with Januvia and Metformin ER (did not increase to bid after last visit). Will try to increase the dose to 500 mg bid as sugars are still above target. - We rechecked her HbA1c:  7.3% (improved) - I suggested to:   Patient Instructions  Please continue: - Januvia 100 mg before b'fast.  Try to increase: - Metformin ER to 500 mg 2x daily.  Please call and schedule an eye appt with Dr. Randon GoldsmithLyles: [facility]:  Dr. Jeni SallesGraham W. Lyles MD ?  Address: [address], [place], [place] [address]  [phone]  Please get the book: Dr. Lequita AsalNeal Barnard - Program for Reversing Diabetes  Please return in 3 months with your sugar log.   - continuechecking sugars at different times of the day - check 1-2 times a day, rotating checks - advised for yearly eye exams >> needs one - Return to clinic in 3 mo with sugar log   Carlus Pavlovristina Alaysia Lightle, MD PhD [facility]

## 2016-04-10 NOTE — Patient Instructions (Addendum)
Please continue: - Januvia 100 mg before b'fast.  Try to increase: - Metformin ER to 500 mg 2x daily.  Please call and schedule an eye appt with Dr. Randon GoldsmithLyles: [facility]:  Dr. Jeni SallesGraham W. Lyles MD ?  Address: [address], [place], [place] [address]  [phone]  Please get the book: Dr. Lequita AsalNeal Barnard - Program for Reversing Diabetes  Please return in 3 months with your sugar log.

## 2016-07-10 ENCOUNTER — Encounter: Payer: Self-pay | Admitting: Internal Medicine

## 2016-07-10 ENCOUNTER — Ambulatory Visit (INDEPENDENT_AMBULATORY_CARE_PROVIDER_SITE_OTHER): Payer: Medicare Other | Admitting: Internal Medicine

## 2016-07-10 VITALS — BP 130/82 | HR 85 | Ht 62.5 in | Wt 155.0 lb

## 2016-07-10 DIAGNOSIS — E1165 Type 2 diabetes mellitus with hyperglycemia: Secondary | ICD-10-CM | POA: Diagnosis not present

## 2016-07-10 LAB — POCT GLYCOSYLATED HEMOGLOBIN (HGB A1C): Hemoglobin A1C: 7.7

## 2016-07-10 MED ORDER — EMPAGLIFLOZIN 10 MG PO TABS: 10.0000 mg | ORAL_TABLET | Freq: Every day | ORAL | 3 refills | Status: DC

## 2016-07-10 NOTE — Addendum Note (Signed)
Addended by: Darene LamerHOMPSON, Tiara Maultsby T on: 07/10/2016 08:44 AM   Modules accepted: Orders

## 2016-07-10 NOTE — Progress Notes (Signed)
Patient ID: Theadora RamaGwendolyn D Costanzo, female   DOB: 1946-05-08, 71 y.o.   MRN: [id number]   HPI: Theadora RamaGwendolyn D Caywood is a 71 y.o.-year-old female, returning for f/u for DM2, dx in 2015, prev. Prediabetes, non-insulin-dependent, uncontrolled, without complications. Last visit 3 mo ago.  She had gastroenteritis, then diverticulitis. She had to come off Metformin few weeks ago.  Last hemoglobin A1c was: Lab Results  Component Value Date   HGBA1C 7.3 04/10/2016   HGBA1C 7.7 12/15/2015  10/2015: HbA1c 8.2%  Pt is on a regimen of: - Januvia 100 mg before b'fast. - Prev. Before dinner, started 11/2015 Stopped Metformin ER 500 mg bid in 06/2015 She was on Metformin 500 mg 1x a day with dinner (500 mg tid >> AP/diarrhea)  >> stopped when started Januvia  Pt checks her sugars 0-1x a day and they are (after stopping Metformin): - am: n/c >> 133-176 >> 121-193 >> 163-205 - 2h after b'fast: n/c - before lunch: n/c >> 178 >> n/c - 2h after lunch: 180s >> 153-169, 192 >> 151 >> 231 - before dinner: n/c >> 130-162 >> 128-165 >> n/c - 2h after dinner: n/c >> 137 >> n/c - bedtime: n/c >> 203, 233 >> 127 >> n/c - nighttime: n/c >> 169-208 >> n/c No lows; ? hypoglycemia awareness.  Glucometer: OneTouch  She will go to [employer]. Did not start yet.  - no CKD, last BUN/creatinine:  10/22/2015: 21/0.79, ACR 35.6 Lab Results  Component Value Date   BUN 2 (L) 01/04/2010   BUN 4 (L) 01/03/2010   CREATININE 0.82 01/04/2010   CREATININE 0.79 01/03/2010   - last set of lipids: 10/22/2015: 226/201/48/138 No results found for: CHOL, HDL, LDLCALC, LDLDIRECT, TRIG, CHOLHDL - last eye exam was in 04/2015. No DR. Has this scheduled. - no numbness and tingling in her feet. Last foot exam was normal on 10/22/2015.  ROS: Constitutional: no weight gain, no fatigue, no subjective hyperthermia/hypothermia Eyes: no blurry vision, no xerophthalmia ENT: no sore throat, no nodules palpated in throat, no  dysphagia/odynophagia, no hoarseness Cardiovascular: no CP/SOB/palpitations/leg swelling Respiratory: no cough/SOB Gastrointestinal: no N/V/+ D/no C/heartburn Musculoskeletal: no muscle/joint aches Skin: no rashes Neurological: no tremors/numbness/tingling/dizziness  Past medical history: Patient Active Problem List   Diagnosis Date Noted  . Type 2 diabetes mellitus with hyperglycemia (HCC) 12/15/2015    Priority: High  . 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 Cervical pain 12/15/2015  . Diverticulitis of colon 12/15/2015  . Acid reflux 12/15/2015  . Essential (primary) hypertension 12/15/2015  . Benign breast lumps 12/15/2015  . Knee pain 11/08/2011   No past surgical history on file.   Social History   Social History  . Marital status: Married    Spouse name: N/A  . Number of children: 2   Occupational History  . Retired    Social History Main Topics  . Smoking status: Never Smoker  . Smokeless tobacco: Never Used  . Alcohol use Socially   . Drug use: No    Current Outpatient Prescriptions on File Prior to Visit  Medication Sig Dispense Refill  . atorvastatin (LIPITOR) 10 MG tablet     . JANUVIA 100 MG tablet Take 1 tablet (100 mg total) by mouth daily. 90 tablet 3  . metFORMIN (GLUCOPHAGE-XR) 500 MG 24 hr tablet Take 1 tablet (500 mg total) by mouth 2 (two) times daily with a meal. 180 tablet 3  . MICARDIS HCT 80-25 MG per tablet Take 1 tablet by mouth daily.    .Marland Kitchen  Multiple Vitamins-Minerals (WOMENS MULTIVITAMIN PO) Take by mouth daily.    Marland Kitchen omeprazole (PRILOSEC) 20 MG capsule Take 20 mg by mouth daily.    Letta Pate DELICA LANCETS 33G MISC     . ONETOUCH VERIO test strip      No current facility-administered medications on file prior to visit.     Allergies  Allergen Reactions  . Sulfa Antibiotics Rash   No family history on file.  PE: BP 130/82 (BP Location: Left Arm, Patient Position: Sitting)   Pulse 85   Ht 5' 2.5" (1.588 m)   Wt 155 lb  (70.3 kg)   SpO2 98%   BMI 27.90 kg/m  Wt Readings from Last 3 Encounters:  07/10/16 155 lb (70.3 kg)  04/10/16 150 lb (68 kg)  02/08/16 153 lb (69.4 kg)   Constitutional: Slightly overweight, in NAD Eyes: PERRLA, EOMI, no exophthalmos ENT: moist mucous membranes, no thyromegaly, no cervical lymphadenopathy Cardiovascular: RRR, No MRG Respiratory: CTA B Gastrointestinal: abdomen soft, NT, ND, BS+ Musculoskeletal: no deformities, strength intact in all 4 Skin: moist, warm, no rashes Neurological: no tremor with outstretched hands, DTR normal in all 4  ASSESSMENT: 1. DM2, non-insulin-dependent, uncontrolled, without long term complications, but with hyperglycemia  PLAN:  1. Patient with long-standing, uncontrolled diabetes, on oral antidiabetic regimen, with Januvia and prev. Metformin ER >> had to stop b/c diarrhea. Her sugars are higher >> will try to start Jardiance low dose >> advised her to increase to 2 x10 mg after 3 weeks if tolerated and if sugars still high. Will check a BMP at next visit. - We rechecked her HbA1c:  7.7% (higher) - I suggested to:  Patient Instructions  Please continue: - Januvia 100 mg in am  Please add: - Jardiance 10 mg daily in am, along with Januvia  Please return in 1.5 months with your sugar log.    - continue checking sugars at different times of the day - check 1 time a day, but rotating checks - check some sugars later in the day - advised for yearly eye exams >> needs one >> scheduled - Return to clinic in 1.5 mo with sugar log   Carlus Pavlov, MD PhD [facility] Endocrinology

## 2016-07-10 NOTE — Patient Instructions (Addendum)
Please continue: - Januvia 100 mg in am  Please add: - Jardiance 10 mg daily in am, along with Januvia  Please return in 1.5 months with your sugar log.

## 2016-08-24 LAB — HM DIABETES EYE EXAM

## 2016-08-28 ENCOUNTER — Encounter: Payer: Self-pay | Admitting: Endocrinology

## 2016-08-30 ENCOUNTER — Ambulatory Visit (INDEPENDENT_AMBULATORY_CARE_PROVIDER_SITE_OTHER): Payer: Medicare Other | Admitting: Internal Medicine

## 2016-08-30 ENCOUNTER — Encounter: Payer: Self-pay | Admitting: Internal Medicine

## 2016-08-30 VITALS — BP 122/80 | HR 89 | Ht 62.5 in | Wt 152.0 lb

## 2016-08-30 DIAGNOSIS — E1165 Type 2 diabetes mellitus with hyperglycemia: Secondary | ICD-10-CM

## 2016-08-30 MED ORDER — METFORMIN HCL ER 500 MG PO TB24: 500.0000 mg | ORAL_TABLET | Freq: Every day | ORAL | 3 refills | Status: DC

## 2016-08-30 NOTE — Patient Instructions (Addendum)
Please continue: - Januvia 100 mg in am  Please restart: - Metformin ER 500 mg daily for 4 days, then increase to 1000 mg daily with dinner.  Stop Jardiance.  Please return in 3 months with your sugar log.

## 2016-08-30 NOTE — Progress Notes (Signed)
Patient ID: Sandra [name], female   DOB: 06-Jan-1946, 71 y.o.   MRN: [id number]   HPI: LARESA OSHIRO is a 71 y.o.-year-old female, returning for f/u for DM2, dx in 2015, prev. Prediabetes, non-insulin-dependent, uncontrolled, without complications. Last visit 3 mo ago.  Before last visit, she had gastroenteritis, then diverticulitis >> had to come off Metformin >> we started Jardiance as her HbA1c had increased.  Last hemoglobin A1c was: Lab Results  Component Value Date   HGBA1C 7.7 07/10/2016   HGBA1C 7.3 04/10/2016   HGBA1C 7.7 12/15/2015  10/2015: HbA1c 8.2%  Pt is on a regimen of: - Januvia 100 mg before b'fast. - started 11/2015 - Jardiance 10 mg daily in am - started 07/2016 >> could not tolerate it b/c increased urination and hyperglycemia  Stopped Metformin ER 500 mg bid in 06/2015 She was on Metformin 500 mg 1x a day with dinner (500 mg tid >> AP/diarrhea)  >> stopped when started Januvia  Pt checks her sugars 0-1x a day and they are:  - am: n/c >> 133-176 >> 121-193 >> 163-205 >> 140, 180-213 - 2h after b'fast: n/c - before lunch: n/c >> 178 >> n/c - 2h after lunch: 180s >> 153-169, 192 >> 151 >> 231 >> 150s - before dinner: n/c >> 130-162 >> 128-165 >> n/c - 2h after dinner: n/c >> 137 >> n/c - bedtime: n/c >> 203, 233 >> 127 >> n/c - nighttime: n/c >> 169-208 >> n/c No lows; ? hypoglycemia awareness.  Glucometer: OneTouch  She started to go to [employer].  - no CKD, last BUN/creatinine:  10/22/2015: 21/0.79, ACR 35.6 Lab Results  Component Value Date   BUN 2 (L) 01/04/2010   BUN 4 (L) 01/03/2010   CREATININE 0.82 01/04/2010   CREATININE 0.79 01/03/2010   - last set of lipids: 10/22/2015: 226/201/48/138 No results found for: CHOL, HDL, LDLCALC, LDLDIRECT, TRIG, CHOLHDL - last eye exam was in 08/24/2016. No diabetic retinopathy - No numbness and tingling in her feet. Last foot exam was normal on 10/22/2015.  ROS: Constitutional: no weight  gain/no weight loss, no fatigue, no subjective hyperthermia, no subjective hypothermia Eyes: no blurry vision, no xerophthalmia ENT: no sore throat, no nodules palpated in throat, no dysphagia, no odynophagia, no hoarseness Cardiovascular: no CP/no SOB/no palpitations/no leg swelling Respiratory: no cough/no SOB Gastrointestinal: no N/no V/no D/no C Musculoskeletal: no muscle aches/no joint aches Skin: no rashes Neurological: no tremors/no numbness/no tingling/no dizziness  Past medical history: Patient Active Problem List   Diagnosis Date Noted  . Type 2 diabetes mellitus with hyperglycemia (HCC) 12/15/2015    Priority: High  . 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 Cervical pain 12/15/2015  . Diverticulitis of colon 12/15/2015  . Acid reflux 12/15/2015  . Essential (primary) hypertension 12/15/2015  . Benign breast lumps 12/15/2015  . Knee pain 11/08/2011   No past surgical history on file.   Social History   Social History  . Marital status: Married    Spouse name: N/A  . Number of children: 2   Occupational History  . Retired    Social History Main Topics  . Smoking status: Never Smoker  . Smokeless tobacco: Never Used  . Alcohol use Socially   . Drug use: No    Current Outpatient Prescriptions on File Prior to Visit  Medication Sig Dispense Refill  . atorvastatin (LIPITOR) 10 MG tablet     . empagliflozin (JARDIANCE) 10 MG TABS tablet Take 10 mg by mouth daily.  30 tablet 3  . JANUVIA 100 MG tablet Take 1 tablet (100 mg total) by mouth daily. 90 tablet 3  . MICARDIS HCT 80-25 MG per tablet Take 1 tablet by mouth daily.    . Multiple Vitamins-Minerals (WOMENS MULTIVITAMIN PO) Take by mouth daily.    Marland Kitchen omeprazole (PRILOSEC) 20 MG capsule Take 20 mg by mouth daily.    Letta Pate DELICA LANCETS 33G MISC     . ONETOUCH VERIO test strip      No current facility-administered medications on file prior to visit.     Allergies  Allergen Reactions  . Sulfa  Antibiotics Rash   No family history on file.  PE: BP 122/80 (BP Location: Left Arm, Patient Position: Sitting)   Pulse 89   Ht 5' 2.5" (1.588 m)   Wt 152 lb (68.9 kg)   SpO2 98%   BMI 27.36 kg/m  Wt Readings from Last 3 Encounters:  08/30/16 152 lb (68.9 kg)  07/10/16 155 lb (70.3 kg)  04/10/16 150 lb (68 kg)   Constitutional: Slightly overweight, in NAD Eyes: PERRLA, EOMI, no exophthalmos ENT: moist mucous membranes, no thyromegaly, no cervical lymphadenopathy Cardiovascular: RRR, No MRG Respiratory: CTA B Gastrointestinal: abdomen soft, NT, ND, BS+ Musculoskeletal: no deformities, strength intact in all 4 Skin: moist, warm, no rashes Neurological: no tremor with outstretched hands, DTR normal in all 4  ASSESSMENT: 1. DM2, non-insulin-dependent, uncontrolled, without long term complications, but with hyperglycemia  PLAN:  1. Patient with long-standing, uncontrolled diabetes, on oral antidiabetic regimen, initially with Januvia and metformin ER, however, she had to stop the metformin because of diarrhea. At that time, she had diverticulitis. Instead of metformin, we added Jardiance at last visit. However, sugars are higher and she is urinating constantly >> will need to stop Jardiance. However, we can now add back Metformin ER at night >> this will improve am sugars. If not enough >> may need Glipizide ER low dose. - Last HbA1c reviewed with the patient today: 7.7% (higher) - I suggested to:  Patient Instructions  Please continue: - Januvia 100 mg in am  Please restart: - Metformin ER 500 mg daily for 4 days, then increase to 1000 mg daily with dinner.  Stop Jardiance.  Please return in 3 months with your sugar log.   - Continue checking sugars once a day, rotating check times including some sugars later in the day - advised for yearly eye exams >> She is up-to-date. No retinopathy - Return to clinic in 3 months with sugar log   Carlus Pavlov, MD PhD [facility]  Endocrinology

## 2016-08-31 ENCOUNTER — Ambulatory Visit: Payer: Medicare Other | Admitting: Internal Medicine

## 2016-10-30 ENCOUNTER — Telehealth: Payer: Self-pay

## 2016-10-30 ENCOUNTER — Telehealth: Payer: Self-pay | Admitting: Internal Medicine

## 2016-10-30 MED ORDER — GLIPIZIDE ER 2.5 MG PO TB24: 2.5000 mg | ORAL_TABLET | Freq: Every day | ORAL | 5 refills | Status: DC

## 2016-10-30 NOTE — Telephone Encounter (Signed)
please call patient: Dr Elvera LennoxGherghe said that if necessary, you should add back a small amount of Glipizide.  I have sent a prescription to your pharmacy

## 2016-10-30 NOTE — Telephone Encounter (Signed)
I called patient and she advised that for the past week her blood sugar has been in the 230 range every morning, today this morning it 280 @ 11:00 am which is the highest it has ever been. Patient states she has had no changes except she was placed on eye drops around a week ago and did not know if this could cause the sugars to increase to this amount. Patient is taking januvia in the morning, and metformin 1 tab at dinner. Patient would like to know what changes can be made, until Dr.Gherghe gets back or until she sees her in August.   Please advise in her absence, Thank you!

## 2016-10-30 NOTE — Telephone Encounter (Signed)
Patient asking for return phone call, has questions about blood sugar and "other health issues" per patient.

## 2016-10-30 NOTE — Telephone Encounter (Signed)
Called and submitted questions to covering MD.

## 2016-10-31 ENCOUNTER — Telehealth: Payer: Self-pay

## 2016-10-31 NOTE — Telephone Encounter (Signed)
Called patient and LVM advising of medication changes. Left call back number if any questions.

## 2016-10-31 NOTE — Telephone Encounter (Signed)
Called patient and LVM advising of medication changes. Left call back number if any questions.   

## 2016-12-11 ENCOUNTER — Other Ambulatory Visit: Payer: Self-pay | Admitting: Internal Medicine

## 2016-12-11 ENCOUNTER — Encounter: Payer: Self-pay | Admitting: Internal Medicine

## 2016-12-11 ENCOUNTER — Ambulatory Visit (INDEPENDENT_AMBULATORY_CARE_PROVIDER_SITE_OTHER): Payer: Medicare Other | Admitting: Internal Medicine

## 2016-12-11 VITALS — BP 128/84 | HR 84 | Wt 151.4 lb

## 2016-12-11 DIAGNOSIS — E1165 Type 2 diabetes mellitus with hyperglycemia: Secondary | ICD-10-CM | POA: Diagnosis not present

## 2016-12-11 DIAGNOSIS — G8929 Other chronic pain: Secondary | ICD-10-CM

## 2016-12-11 DIAGNOSIS — R809 Proteinuria, unspecified: Secondary | ICD-10-CM

## 2016-12-11 LAB — POCT GLYCOSYLATED HEMOGLOBIN (HGB A1C): Hemoglobin A1C: 7.5

## 2016-12-11 NOTE — Addendum Note (Signed)
Addended by: Emmaline KluverERRELL, Joneric Streight E on: 12/11/2016 10:29 AM   Modules accepted: Orders

## 2016-12-11 NOTE — Patient Instructions (Addendum)
Please continue: - Januvia 100 mg in am - Metformin ER 500 mg daily with dinner - Glipizide XL 2.5 mg in am  Please return in 3 months with your sugar log.

## 2016-12-11 NOTE — Progress Notes (Signed)
Patient ID: [name], female   DOB: 04-25-1946, 71 y.o.   MRN: [id number]   HPI: KARITA DRALLE is a 71 y.o.-year-old female, returning for f/u for DM2, dx in 2015, prev. Prediabetes, non-insulin-dependent, uncontrolled, without long term complications. Last visit 3 mo ago.  She got 2 steroid injections since last visit for post. neck swelling. Sugars higher >> we added Glipizide XL.  Last hemoglobin A1c was: Lab Results  Component Value Date   HGBA1C 7.7 07/10/2016   HGBA1C 7.3 04/10/2016   HGBA1C 7.7 12/15/2015  10/2015: HbA1c 8.2%  Pt is on a regimen of: - Januvia 100 mg before b'fast. - started 11/2015 - Metformin ER 500 mg with dinner - started 08/2016 (could not tolerate the 1000 mg) - Glipizide XL 2.5 mg in am - started 10/2016 She was on Jardiance 10 mg daily in am - started 07/2016 >> could not tolerate it b/c increased urination and hyperglycemia  Stopped Metformin ER 500 mg bid in 06/2015 She was on Metformin 500 mg 1x a day with dinner (500 mg tid >> AP/diarrhea)  >> stopped when started Januvia  Pt checks her sugars 0-1x a day: - am: n/c >> 133-176 >> 121-193 >> 163-205 >> 140, 180-213 >> 88-127, 176 - 2h after b'fast: n/c - before lunch: n/c >> 178 >> n/c - 2h after lunch: 180s >> 153-169, 192 >> 151 >> 231 >> 150s >> 115 - before dinner: n/c >> 130-162 >> 128-165 >> n/c - 2h after dinner: n/c >> 137 >> n/c - bedtime: n/c >> 203, 233 >> 127 >> n/c >> 103-148 - nighttime: n/c >> 169-208 >> n/c No lows; ? hypoglycemia awareness.  Glucometer: OneTouch  She continues to go to [employer].  - No CKD, last BUN/creatinine:  10/22/2015: 21/0.79, ACR 35.6 Lab Results  Component Value Date   BUN 2 (L) 01/04/2010   BUN 4 (L) 01/03/2010   CREATININE 0.82 01/04/2010   CREATININE 0.79 01/03/2010  On Micardis. - last set of lipids: 10/22/2015: 226/201/48/138 No results found for: CHOL, HDL, LDLCALC, LDLDIRECT, TRIG, CHOLHDL - last eye exam was in  08/2016. No DR. She has increased intraocular pressure - treated for this. Dr. Randon Goldsmith.  -  Denies numbness and tingling in her feet. Last foot exam was normal 10/2015.  In 2 weeks she will go to see her daughter in [place].  ROS: Constitutional: no weight gain/no weight loss, no fatigue, no subjective hyperthermia, no subjective hypothermia Eyes: no blurry vision, no xerophthalmia ENT: no sore throat, no nodules palpated in throat, no dysphagia, no odynophagia, no hoarseness Cardiovascular: no CP/no SOB/no palpitations/no leg swelling Respiratory: no cough/no SOB/no wheezing Gastrointestinal: no N/no V/no D/no C/no acid reflux Musculoskeletal: + muscle aches (neck)/no joint aches Skin: no rashes, no hair loss Neurological: no tremors/no numbness/no tingling/no dizziness  I reviewed pt's medications, allergies, PMH, social hx, family hx, and changes were documented in the history of present illness. Otherwise, unchanged from my initial visit note.  Past medical history: Patient Active Problem List   Diagnosis Date Noted  . Type 2 diabetes mellitus with hyperglycemia ([facility]CC) 12/15/2015    Priority: High  . 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 Cervical pain 12/15/2015  . Diverticulitis of colon 12/15/2015  . Acid reflux 12/15/2015  . Essential (primary) hypertension 12/15/2015  . Benign breast lumps 12/15/2015  . Knee pain 11/08/2011   No past surgical history on file.   Social History   Social History  . Marital status: Married  Spouse name: N/A  . Number of children: 2   Occupational History  . Retired    Social History Main Topics  . Smoking status: Never Smoker  . Smokeless tobacco: Never Used  . Alcohol use Socially   . Drug use: No    Current Outpatient Prescriptions on File Prior to Visit  Medication Sig Dispense Refill  . atorvastatin (LIPITOR) 10 MG tablet     . glipiZIDE (GLUCOTROL XL) 2.5 MG 24 hr tablet Take 1 tablet (2.5 mg total) by mouth  daily with breakfast. 30 tablet 5  . JANUVIA 100 MG tablet Take 1 tablet (100 mg total) by mouth daily. 90 tablet 3  . metFORMIN (GLUCOPHAGE-XR) 500 MG 24 hr tablet Take 1 tablet (500 mg total) by mouth daily with breakfast. 180 tablet 3  . MICARDIS HCT 80-25 MG per tablet Take 1 tablet by mouth daily.    . Multiple Vitamins-Minerals (WOMENS MULTIVITAMIN PO) Take by mouth daily.    Marland Kitchen. omeprazole (PRILOSEC) 20 MG capsule Take 20 mg by mouth daily.    Letta Pate. ONETOUCH DELICA LANCETS 33G MISC     . ONETOUCH VERIO test strip      No current facility-administered medications on file prior to visit.     Allergies  Allergen Reactions  . Sulfa Antibiotics Rash   No family history on file.  PE: BP 128/84   Pulse 84   Wt 151 lb 6.4 oz (68.7 kg)   SpO2 98%   BMI 27.25 kg/m  Wt Readings from Last 3 Encounters:  12/11/16 151 lb 6.4 oz (68.7 kg)  08/30/16 152 lb (68.9 kg)  07/10/16 155 lb (70.3 kg)   Constitutional: overweight, in NAD Eyes: PERRLA, EOMI, no exophthalmos ENT: moist mucous membranes, no thyromegaly, no cervical lymphadenopathy Cardiovascular: RRR, No MRG Respiratory: CTA B Gastrointestinal: abdomen soft, NT, ND, BS+ Musculoskeletal: no deformities, strength intact in all 4 Skin: moist, warm, no rashes Neurological: no tremor with outstretched hands, DTR normal in all 4  ASSESSMENT: 1. DM2, non-insulin-dependent, uncontrolled, without long term complications, but with hyperglycemia  2. Microalbuminuria  PLAN:  1. Patient with long-standing, uncontrolled diabetes, on oral antidiabetic regimen,with added low dose metformin and glipizide since last visit. In June, after 2 steroid injection for neck muscle cramp, her sugars were quite high, in the 200s. She contacted the office and we added a low dose glipizide.  Since then, sugars improved, so that today they are almost all at or slightly above goal. - I suggested to:  Patient Instructions  Please continue: - Januvia 100 mg in  am - Metformin ER 500 mg daily with dinner - Glipizide XL 2.5 mg in am  Please return in 3 months with your sugar log.   - today, HbA1c is 7.5% (slightly better), but I expect the next HbA1c to be lower than 7 - continue checking sugars at different times of the day - check 1x a day, rotating checks - advised for yearly eye exams >> she is UTD - Return to clinic in 3 mo with sugar log   2. Microalbuminuria - unclear if from DM - her urine microalbumin level is increased. She will have labs by PCP next month and have this sent to me. -  She continues on Micardis/.  Carlus Pavlovristina Demarquez Ciolek, MD PhD [facility]

## 2016-12-12 ENCOUNTER — Ambulatory Visit
Admission: RE | Admit: 2016-12-12 | Discharge: 2016-12-12 | Disposition: A | Discharge status: Home / Self Care | Payer: Medicare Other | Source: Ambulatory Visit | Attending: Internal Medicine | Admitting: Internal Medicine

## 2016-12-12 DIAGNOSIS — G8929 Other chronic pain: Secondary | ICD-10-CM

## 2017-01-11 ENCOUNTER — Other Ambulatory Visit: Payer: Self-pay | Admitting: Internal Medicine

## 2017-03-13 ENCOUNTER — Other Ambulatory Visit: Payer: Self-pay

## 2017-03-13 ENCOUNTER — Ambulatory Visit: Payer: Medicare Other | Admitting: Internal Medicine

## 2017-03-13 ENCOUNTER — Telehealth: Payer: Self-pay | Admitting: *Deleted

## 2017-03-13 ENCOUNTER — Encounter: Payer: Self-pay | Admitting: Internal Medicine

## 2017-03-13 VITALS — BP 124/78 | HR 80 | Wt 154.0 lb

## 2017-03-13 DIAGNOSIS — E1165 Type 2 diabetes mellitus with hyperglycemia: Secondary | ICD-10-CM | POA: Diagnosis not present

## 2017-03-13 LAB — POCT GLYCOSYLATED HEMOGLOBIN (HGB A1C): Hemoglobin A1C: 7.4

## 2017-03-13 MED ORDER — DULAGLUTIDE 0.75 MG/0.5ML ~~LOC~~ SOAJ: 0.7500 mg | SUBCUTANEOUS | 1 refills | Status: DC

## 2017-03-13 MED ORDER — INSULIN PEN NEEDLE 32G X 4 MM MISC: 5 refills | Status: AC

## 2017-03-13 NOTE — Telephone Encounter (Signed)
Patient called and states that she seen Dr. Elvera LennoxGherghe and was prescribed Trulicity and the needles RX was not sent to the pharmacy. She needs 4 Bd nano 4mm x 32G  That goes with the Trulicity. Please advise. Thank you

## 2017-03-13 NOTE — Telephone Encounter (Signed)
Submitted

## 2017-03-13 NOTE — Addendum Note (Signed)
Addended by: Darene LamerHOMPSON, Keatin Benham T on: 03/13/2017 09:23 AM   Modules accepted: Orders

## 2017-03-13 NOTE — Progress Notes (Signed)
Patient ID: Sandra [name], female   DOB: Sep 04, 1945, 71 y.o.   MRN: [id number]   HPI: Sandra [name] is a 71 y.o.-year-old female, returning for f/u for DM2, dx in 2015, prev. Prediabetes, non-insulin-dependent, uncontrolled, without long term complications. Last visit 3 mo ago.  She travelled a lot over the summer.  Last hemoglobin A1c was: Lab Results  Component Value Date   HGBA1C 7.5 12/11/2016   HGBA1C 7.7 07/10/2016   HGBA1C 7.3 04/10/2016  10/2015: HbA1c 8.2%  Pt is on a regimen of: - Januvia 100 mg before b'fast. - started 11/2015 - Metformin ER 500 mg with dinner - started 08/2016 (could not tolerate 1000 mg) - Glipizide XL 2.5 mg in am - started 10/2016 She was on Jardiance 10 mg daily in am - started 07/2016 >> could not tolerate it b/c increased urination and hyperglycemia  Stopped Metformin ER 500 mg bid in 06/2015 She was on Metformin 500 mg 1x a day with dinner (500 mg tid >> AP/diarrhea)  >> stopped when started Januvia  Pt checks her sugars 0-1x a day: - am: 163-205 >> 140, 180-213 >> 88-127, 176 >> 98, 147-203, 242 - 2h after b'fast: n/c - before lunch: n/c >> 178 >> n/c >> 235 - 2h after lunch:  151 >> 231 >> 150s >> 115 >> n/c - before dinner: 130-162 >> 128-165 >> n/c >> 196 - 2h after dinner: n/c >> 137 >> n/c - bedtime:203, 233 >> 127 >> n/c >> 103-148 >> 196 - nighttime: n/c >> 169-208 >> n/c No lows; ? hypoglycemia awareness.  Glucometer: OneTouch  She continues to go to [employer].  - No CKD, last BUN/creatinine:  01/12/2017: Glu 181, 18/0.8, eGFR 86, ACR 42.1 10/22/2015: 21/0.79, ACR 35.6 Lab Results  Component Value Date   BUN 2 (L) 01/04/2010   BUN 4 (L) 01/03/2010   CREATININE 0.82 01/04/2010   CREATININE 0.79 01/03/2010  On Micardis. - last set of lipids: 01/12/2017: 199/380/41/82 10/22/2015: 226/201/48/138 No results found for: CHOL, HDL, LDLCALC, LDLDIRECT, TRIG, CHOLHDL - last eye exam was in 08/2016 >> No DR. She has  increased intraocular pressure - treated for this. Dr. Prudencio Burly.  -  No numbness and tingling in her feet. Last foot exam was normal 10/2015.  ROS: Constitutional: no weight gain/no weight loss, no fatigue, no subjective hyperthermia, no subjective hypothermia Eyes: no blurry vision, no xerophthalmia ENT: no sore throat, no nodules palpated in throat, no dysphagia, no odynophagia, no hoarseness Cardiovascular: no CP/no SOB/no palpitations/no leg swelling Respiratory: no cough/no SOB/no wheezing Gastrointestinal: no N/no V/no D/no C/no acid reflux Musculoskeletal: no muscle aches/no joint aches Skin: no rashes, no hair loss Neurological: no tremors/no numbness/no tingling/no dizziness  I reviewed pt's medications, allergies, PMH, social hx, family hx, and changes were documented in the history of present illness. Otherwise, unchanged from my initial visit note.   Past medical history: Patient Active Problem List   Diagnosis Date Noted  . Microalbuminuria 12/11/2016    Priority: High  . Type 2 diabetes mellitus with hyperglycemia ([facility]) 12/15/2015    Priority: High  . 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 Cervical pain 12/15/2015  . Diverticulitis of colon 12/15/2015  . Acid reflux 12/15/2015  . Essential (primary) hypertension 12/15/2015  . Benign breast lumps 12/15/2015  . Knee pain 11/08/2011   No past surgical history on file.   Social History   Social History  . Marital status: Married    Spouse name: N/A  . Number  of children: 2   Occupational History  . Retired    Social History Main Topics  . Smoking status: Never Smoker  . Smokeless tobacco: Never Used  . Alcohol use Socially   . Drug use: No    Current Outpatient Medications on File Prior to Visit  Medication Sig Dispense Refill  . atorvastatin (LIPITOR) 10 MG tablet     . glipiZIDE (GLUCOTROL XL) 2.5 MG 24 hr tablet Take 1 tablet (2.5 mg total) by mouth daily with breakfast. 30 tablet 5  . JANUVIA  100 MG tablet Take 1 tablet (100 mg total) by mouth daily. 90 tablet 3  . latanoprost (XALATAN) 0.005 % ophthalmic solution Place 1 drop into both eyes at bedtime.    . metFORMIN (GLUCOPHAGE-XR) 500 MG 24 hr tablet Take 1 tablet (500 mg total) by mouth daily with breakfast. 180 tablet 3  . metFORMIN (GLUCOPHAGE-XR) 500 MG 24 hr tablet TAKE 1 TABLET (500 MG TOTAL) BY MOUTH DAILY WITH SUPPER. 90 tablet 3  . MICARDIS HCT 80-25 MG per tablet Take 1 tablet by mouth daily.    . Multiple Vitamins-Minerals (WOMENS MULTIVITAMIN PO) Take by mouth daily.    Marland Kitchen omeprazole (PRILOSEC) 20 MG capsule Take 20 mg by mouth daily.    [name] DELICA LANCETS 26J MISC     . ONETOUCH VERIO test strip      No current facility-administered medications on file prior to visit.     Allergies  Allergen Reactions  . Sulfa Antibiotics Rash   No family history on file.  PE: BP 124/78 (BP Location: Left Arm, Patient Position: Sitting)   Pulse 80   Wt 154 lb (69.9 kg)   SpO2 96%   BMI 27.72 kg/m  Wt Readings from Last 3 Encounters:  03/13/17 154 lb (69.9 kg)  12/11/16 151 lb 6.4 oz (68.7 kg)  08/30/16 152 lb (68.9 kg)   Constitutional: overweight, in NAD Eyes: PERRLA, EOMI, no exophthalmos ENT: moist mucous membranes, no thyromegaly, no cervical lymphadenopathy Cardiovascular: RRR, No MRG Respiratory: CTA B Gastrointestinal: abdomen soft, NT, ND, BS+ Musculoskeletal: no deformities, strength intact in all 4 Skin: moist, warm, no rashes Neurological: no tremor with outstretched hands, DTR normal in all 4  ASSESSMENT: 1. DM2, non-insulin-dependent, uncontrolled, without long term complications, but with hyperglycemia  2. Microalbuminuria  PLAN:  1. Patient with long-standing, uncontrolled DM, with worse ctrl at this visit >> she almost exclusively checks am sugars >> discussed to also check later in the day. Sugars in am are high: [address] ave. >> discussed we need intensification of tx >> will start  Trulicity and stop Januvia - discussed mech's of action and potential SEs of Trulicity - I suggested to:  Patient Instructions  Please continue: - Januvia 100 mg in am - Metformin ER 500 mg daily with dinner - Glipizide XL 2.5 mg in am  Please start Trulicity 3.35 mg weekly. Let me know when you are close to running out to call in the higher dose to your pharmacy (1.5 mg).  Stop Januvia 1 week after you start Trulicity.  Please return in 3 months with your sugar log.    - today, HbA1c is 7.4% (slightly better) - continue checking sugars at different times of the day - check 1x a day, rotating checks - advised for yearly eye exams >> she is UTD - Return to clinic in 3 mo with sugar log   2. Microalbuminuria - probably from DM - brings records of new ACR >>  slightly higher >> discussed that we need better DM ctrl - she continues Micardis  Philemon Kingdom, MD PhD [facility]

## 2017-03-13 NOTE — Patient Instructions (Addendum)
Please continue: - Januvia 100 mg in am - Metformin ER 500 mg daily with dinner - Glipizide XL 2.5 mg in am  Please start Trulicity 0.75 mg weekly. Let me know when you are close to running out to call in the higher dose to your pharmacy (1.5 mg).  Stop Januvia 1 week after you start Trulicity.  Please return in 3 months with your sugar log.

## 2017-03-22 ENCOUNTER — Other Ambulatory Visit: Payer: Self-pay

## 2017-03-22 MED ORDER — GLIPIZIDE ER 2.5 MG PO TB24: 2.5000 mg | ORAL_TABLET | Freq: Every day | ORAL | 5 refills | Status: DC

## 2017-03-23 ENCOUNTER — Other Ambulatory Visit: Payer: Self-pay

## 2017-03-23 MED ORDER — GLIPIZIDE ER 2.5 MG PO TB24: 2.5000 mg | ORAL_TABLET | Freq: Every day | ORAL | 2 refills | Status: DC

## 2017-04-24 ENCOUNTER — Other Ambulatory Visit: Payer: Self-pay | Admitting: Internal Medicine

## 2017-05-04 ENCOUNTER — Telehealth: Payer: Self-pay | Admitting: Internal Medicine

## 2017-05-04 NOTE — Telephone Encounter (Signed)
Are you aware of the side effect explained below?

## 2017-05-04 NOTE — Telephone Encounter (Signed)
That is unusual.  I would consider restarting the metformin but stopping the glipizide if her sugars are still low.  Continue Trulicity.

## 2017-05-04 NOTE — Telephone Encounter (Signed)
Called and LVM for pt on home phone and number left in the message below to call never picked up a VM and no one answered

## 2017-05-04 NOTE — Telephone Encounter (Signed)
Pt is aware.  

## 2017-05-04 NOTE — Telephone Encounter (Signed)
Patient started on Trulicity, then Metformin. She cut back on the Metformin because were down, then she started only taking the Trulicity and glipzide. She had no problem while taking the 3 medications. She is now itching- (with no rash) so her thought is to go back to taking the 3 medications.  Please advise  patient at ph# [phone]

## 2017-05-04 NOTE — Telephone Encounter (Signed)
Patient returned Sandra [name]'s call. Patient will wait for [name] to call her/Cell# [phone]

## 2017-05-06 ENCOUNTER — Other Ambulatory Visit: Payer: Self-pay | Admitting: Internal Medicine

## 2017-05-14 ENCOUNTER — Telehealth: Payer: Self-pay | Admitting: Internal Medicine

## 2017-05-14 NOTE — Telephone Encounter (Signed)
Patient has continued itching-been going on 2 weeks-newest medication Trulicity-must determine whether it is a side effect of Trulicity. Please call patient to advise at ph# [phone]. Blood sugar today was 140. FYI-She did not take Trulicity yesterday. She is still taking Metformin and Gilipivide

## 2017-05-14 NOTE — Telephone Encounter (Signed)
Pt is aware.  

## 2017-05-14 NOTE — Telephone Encounter (Signed)
Please advise on below, we discussed with her before that it was not thought to be the trulicity in prior telephone call.

## 2017-05-14 NOTE — Telephone Encounter (Signed)
Please ask her to go back to Januvia instead of trulicity.

## 2017-06-13 ENCOUNTER — Ambulatory Visit: Payer: Medicare Other | Admitting: Internal Medicine

## 2017-06-13 ENCOUNTER — Encounter: Payer: Self-pay | Admitting: Internal Medicine

## 2017-06-13 VITALS — BP 138/84 | HR 86 | Temp 97.8°F | Ht 62.5 in | Wt 154.0 lb

## 2017-06-13 DIAGNOSIS — E1165 Type 2 diabetes mellitus with hyperglycemia: Secondary | ICD-10-CM | POA: Diagnosis not present

## 2017-06-13 DIAGNOSIS — R809 Proteinuria, unspecified: Secondary | ICD-10-CM

## 2017-06-13 LAB — POCT GLYCOSYLATED HEMOGLOBIN (HGB A1C): Hemoglobin A1C: 7.4

## 2017-06-13 NOTE — Progress Notes (Signed)
Patient ID: Sandra [name], female   DOB: Jul 12, 1945, 72 y.o.   MRN: [id number]   HPI: Sandra [name] is a 72 y.o.-year-old female, returning for f/u for DM2, dx in 2015, prev. Prediabetes, non-insulin-dependent, uncontrolled, without long term complications. Last visit 3 mo ago.  At last visit, we started Trulicity, which she loved, and her sugars were great on it.  She was able to use it for a month, however, she developed a rash over her arms and legs so she had to stop.  She is not completely sure whether the rash was from Trulicity or not but she could not identify any other triggers.,   Last hemoglobin A1c was: Lab Results  Component Value Date   HGBA1C 7.4 03/13/2017   HGBA1C 7.5 12/11/2016   HGBA1C 7.7 07/10/2016  10/2015: HbA1c 8.2%  Pt is on a regimen of: - Metformin ER 50 mg daily with dinner -cannot tolerate a higher dose - Glipizide XL 2.5 mg in am She was on Januvia 100 mg daily in am She was on Jardiance 10 mg daily in am - started 07/2016 >> could not tolerate it b/c increased urination and hyperglycemia  Stopped Metformin ER 500 mg bid in 06/2015 She was on Metformin 500 mg 1x a day with dinner (500 mg tid >> AP/diarrhea)  >> stopped when started Januvia We tried Trulicity >> rash on arms and legs..  Pt checks her sugars 0-1x a day: - am: 140, 180-213 >> 88-127, 176 >> 98, 147-203, 242 >> 87, 115-187, 196, 234 - 2h after b'fast: n/c - before lunch: n/c >> 178 >> n/c >> 235 >> n/c - 2h after lunch:  151 >> 231 >> 150s >> 115 >> n/c - before dinner: 130-162 >> 128-165 >> n/c >> 196 >> n/c - 2h after dinner: n/c >> 137 >> n/c - bedtime:203, 233 >> 127 >> n/c >> 103-148 >> 196 >> n/c - nighttime: n/c >> 169-208 >> n/c Unclear at what level she has hypoglycemia awareness. Highest: 200.  Glucometer: OneTouch  Continues to go to [employer].  - No CKD, last BUN/creatinine:  01/12/2017: Glu 181, 18/0.8, eGFR 86, ACR 42.1 10/22/2015: 21/0.79, ACR 35.6 Lab  Results  Component Value Date   BUN 2 (L) 01/04/2010   BUN 4 (L) 01/03/2010   CREATININE 0.82 01/04/2010   CREATININE 0.79 01/03/2010   No results found for: MICRALBCREAT  On Micardis.  - + HL; last set of lipids: 01/12/2017: 199/380/41/82 10/22/2015: 226/201/48/138 No results found for: CHOL, HDL, LDLCALC, LDLDIRECT, TRIG, CHOLHDL  Not on statins.  - last eye exam was in 05/11/2017: No DR. She has increased intraocular pressure - treated for this. Dr. Prudencio Burly.  -  No numbness and tingling in her feet. Last foot exam was normal 10/2015.  ROS: Constitutional: no weight gain/no weight loss, no fatigue, no subjective hyperthermia, no subjective hypothermia Eyes: no blurry vision, no xerophthalmia ENT: no sore throat, no nodules palpated in throat, no dysphagia, no odynophagia, no hoarseness Cardiovascular: no CP/no SOB/no palpitations/no leg swelling Respiratory: no cough/no SOB/no wheezing Gastrointestinal: no N/no V/no D/no C/no acid reflux Musculoskeletal: no muscle aches/no joint aches Skin: no rashes, no hair loss Neurological: no tremors/no numbness/no tingling/no dizziness  I reviewed pt's medications, allergies, PMH, social hx, family hx, and changes were documented in the history of present illness. Otherwise, unchanged from my initial visit note.  Past medical history: Patient Active Problem List   Diagnosis Date Noted  . Microalbuminuria 12/11/2016  Priority: High  . Type 2 diabetes mellitus with hyperglycemia ([place]) 12/15/2015    Priority: High  . 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 Cervical pain 12/15/2015  . Diverticulitis of colon 12/15/2015  . Acid reflux 12/15/2015  . Essential (primary) hypertension 12/15/2015  . Benign breast lumps 12/15/2015  . Knee pain 11/08/2011   Social History   Social History  . Marital status: Married    Spouse name: N/A  . Number of children: 2   Occupational History  . Retired    Social History Main  Topics  . Smoking status: Never Smoker  . Smokeless tobacco: Never Used  . Alcohol use Socially   . Drug use: No    Current Outpatient Medications on File Prior to Visit  Medication Sig Dispense Refill  . atorvastatin (LIPITOR) 10 MG tablet     . glipiZIDE (GLUCOTROL XL) 2.5 MG 24 hr tablet Take 1 tablet (2.5 mg total) daily with breakfast by mouth. 90 tablet 2  . Insulin Pen Needle 32G X 4 MM MISC Use to inject insulin weekly 100 each 5  . latanoprost (XALATAN) 0.005 % ophthalmic solution Place 1 drop into both eyes at bedtime.    . metFORMIN (GLUCOPHAGE-XR) 500 MG 24 hr tablet TAKE 1 TABLET (500 MG TOTAL) BY MOUTH DAILY WITH SUPPER. 90 tablet 3  . MICARDIS HCT 80-25 MG per tablet Take 1 tablet by mouth daily.    . Multiple Vitamins-Minerals (WOMENS MULTIVITAMIN PO) Take by mouth daily.    Marland Kitchen omeprazole (PRILOSEC) 20 MG capsule Take 20 mg by mouth daily.    [name] DELICA LANCETS 75T MISC     . ONETOUCH VERIO test strip     . TRULICITY 7.00 FV/4.9SW SOPN INJECT 0.75 MG ONCE A WEEK INTO THE SKIN. 2 pen 1   No current facility-administered medications on file prior to visit.     Allergies  Allergen Reactions  . Sulfa Antibiotics Rash   No family history on file.  PE: BP 138/84 (BP Location: Left Arm, Patient Position: Sitting, Cuff Size: Normal)   Pulse 86   Temp 97.8 F (36.6 C) (Oral)   Ht 5' 2.5" (1.588 m)   Wt 154 lb (69.9 kg)   SpO2 96%   BMI 27.72 kg/m  Wt Readings from Last 3 Encounters:  06/13/17 154 lb (69.9 kg)  03/13/17 154 lb (69.9 kg)  12/11/16 151 lb 6.4 oz (68.7 kg)   Constitutional: overweight, in NAD Eyes: PERRLA, EOMI, no exophthalmos ENT: moist mucous membranes, no thyromegaly, no cervical lymphadenopathy Cardiovascular: RRR, No MRG Respiratory: CTA B Gastrointestinal: abdomen soft, NT, ND, BS+ Musculoskeletal: no deformities, strength intact in all 4 Skin: moist, warm, no rashes Neurological: no tremor with outstretched hands, DTR normal in all  4  ASSESSMENT: 1. DM2, non-insulin-dependent, uncontrolled, without long term complications, but with hyperglycemia  2. Microalbuminuria  PLAN:  1. Patient with long-standing, uncontrolled, type 2 diabetes, on metformin, glipizide, and previously on Trulicity which was working great, but she had to stop due to rash.  It is unclear whether the rash was caused by Trulicity.  I did suggest a month ago to restart Januvia while off Trulicity, but she was concerned about side effects.  At this visit, we discussed about the fact that [place] has a very good side effect profile and I advised her to start back on it.  I also advised her to start taking sugars later in the day also, since now she is only checking in the  morning.  She loves Trulicity so much that he is willing to give it another try.  We discussed to possibly do this at next visit. - I suggested to:  Patient Instructions  Please continue: - Metformin ER 500 mg daily with dinner - Glipizide XL 2.5 mg in am  Please restart: - Januvia 100 mg daily in am  Please return in 3 months with your sugar log.   - today, HbA1c is 7.4% (stable)  - continue checking sugars at different times of the day - check 1x a day, rotating checks - advised for yearly eye exams >> she is UTD - Return to clinic in 3 mo with sugar log   2. Microalbuminuria - likely 2/2 DM - She is very concerned about this. - Again discussed that good diabetes control is key to manage this - Reassured her that [place] does not impair kidney function - Also, continue Micardis.  Philemon Kingdom, MD PhD [facility] Endocrinology

## 2017-06-13 NOTE — Patient Instructions (Addendum)
Please continue: - Metformin ER 500 mg daily with dinner - Glipizide XL 2.5 mg in am  Please restart: - Januvia 100 mg daily in am  Please return in 3 months with your sugar log.

## 2017-06-15 ENCOUNTER — Ambulatory Visit: Payer: Medicare Other | Admitting: Internal Medicine

## 2017-07-19 ENCOUNTER — Other Ambulatory Visit: Payer: Self-pay | Admitting: Internal Medicine

## 2017-07-19 ENCOUNTER — Ambulatory Visit
Admission: RE | Admit: 2017-07-19 | Discharge: 2017-07-19 | Disposition: A | Discharge status: Home / Self Care | Payer: Medicare Other | Source: Ambulatory Visit | Attending: Internal Medicine | Admitting: Internal Medicine

## 2017-07-19 DIAGNOSIS — Z1231 Encounter for screening mammogram for malignant neoplasm of breast: Secondary | ICD-10-CM

## 2017-07-26 ENCOUNTER — Other Ambulatory Visit: Payer: Self-pay | Admitting: Internal Medicine

## 2017-07-27 NOTE — Telephone Encounter (Signed)
Called pt to confirm Rx request for Januvia. No answer.

## 2017-09-03 ENCOUNTER — Ambulatory Visit: Payer: Medicare Other | Admitting: Internal Medicine

## 2017-09-28 ENCOUNTER — Encounter: Payer: Self-pay | Admitting: Emergency Medicine

## 2017-09-28 ENCOUNTER — Emergency Department: Payer: Medicare Other

## 2017-09-28 ENCOUNTER — Emergency Department
Admission: EM | Admit: 2017-09-28 | Discharge: 2017-09-28 | Disposition: A | Discharge status: Home / Self Care | Payer: Medicare Other | Attending: Emergency Medicine | Admitting: Emergency Medicine

## 2017-09-28 ENCOUNTER — Other Ambulatory Visit: Payer: Self-pay

## 2017-09-28 DIAGNOSIS — Y9301 Activity, walking, marching and hiking: Secondary | ICD-10-CM | POA: Diagnosis not present

## 2017-09-28 DIAGNOSIS — E119 Type 2 diabetes mellitus without complications: Secondary | ICD-10-CM | POA: Diagnosis not present

## 2017-09-28 DIAGNOSIS — Z7984 Long term (current) use of oral hypoglycemic drugs: Secondary | ICD-10-CM | POA: Insufficient documentation

## 2017-09-28 DIAGNOSIS — M25561 Pain in right knee: Secondary | ICD-10-CM | POA: Diagnosis not present

## 2017-09-28 DIAGNOSIS — Y999 Unspecified external cause status: Secondary | ICD-10-CM | POA: Diagnosis not present

## 2017-09-28 DIAGNOSIS — W01198A Fall on same level from slipping, tripping and stumbling with subsequent striking against other object, initial encounter: Secondary | ICD-10-CM | POA: Diagnosis not present

## 2017-09-28 DIAGNOSIS — W19XXXA Unspecified fall, initial encounter: Secondary | ICD-10-CM

## 2017-09-28 DIAGNOSIS — I10 Essential (primary) hypertension: Secondary | ICD-10-CM | POA: Insufficient documentation

## 2017-09-28 DIAGNOSIS — Z79899 Other long term (current) drug therapy: Secondary | ICD-10-CM | POA: Diagnosis not present

## 2017-09-28 DIAGNOSIS — Y92481 Parking lot as the place of occurrence of the external cause: Secondary | ICD-10-CM | POA: Insufficient documentation

## 2017-09-28 DIAGNOSIS — S0990XA Unspecified injury of head, initial encounter: Secondary | ICD-10-CM | POA: Diagnosis present

## 2017-09-28 DIAGNOSIS — S022XXB Fracture of nasal bones, initial encounter for open fracture: Secondary | ICD-10-CM | POA: Diagnosis not present

## 2017-09-28 MED ORDER — CEPHALEXIN 500 MG PO CAPS: 500.0000 mg | ORAL_CAPSULE | Freq: Three times a day (TID) | ORAL | 0 refills | Status: DC

## 2017-09-28 NOTE — ED Provider Notes (Signed)
[facility] Emergency Department Provider Note  ____________________________________________   First MD Initiated Contact with Patient 09/28/17 1318     (approximate)  I have reviewed the triage vital signs and the nursing notes.   HISTORY  Chief Complaint Fall    HPI Sandra [name] is a 72 y.o. female resents emergency department after tripping and falling in the parking lot between [employer] and [employer].  She states that she landed face first.  She is complaining of facial pain and right knee pain.  She did not lose consciousness and is not had any vomiting.  She states she does have a really bad headache.  She states her tetanus is up-to-date.  She denies any arm pain or back pain.  History reviewed. No pertinent past medical history.  Patient Active Problem List   Diagnosis Date Noted  . Microalbuminuria 12/11/2016  . 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 Cervical pain 12/15/2015  . Diverticulitis of colon 12/15/2015  . Acid reflux 12/15/2015  . Essential (primary) hypertension 12/15/2015  . Benign breast lumps 12/15/2015  . Type 2 diabetes mellitus with hyperglycemia ([facility]) 12/15/2015  . Knee pain 11/08/2011    History reviewed. No pertinent surgical history.  Prior to Admission medications   Medication Sig Start Date End Date Taking? Authorizing Provider  atorvastatin (LIPITOR) 10 MG tablet  01/05/16   [provider]  cephALEXin (KEFLEX) 500 MG capsule Take 1 capsule (500 mg total) by mouth 3 (three) times daily. 09/28/17   Sherrie Mustache Roselyn Bering, PA-C  glipiZIDE (GLUCOTROL XL) 2.5 MG 24 hr tablet Take 1 tablet (2.5 mg total) daily with breakfast by mouth. 03/23/17   Romero Belling, MD  Insulin Pen Needle 32G X 4 MM MISC Use to inject insulin weekly 03/13/17   Carlus Pavlov, MD  JANUVIA 100 MG tablet TAKE 1 TABLET BY MOUTH EVERY DAY 08/01/17   Carlus Pavlov, MD  latanoprost (XALATAN) 0.005 % ophthalmic solution Place 1  drop into both eyes at bedtime.    [provider]  metFORMIN (GLUCOPHAGE-XR) 500 MG 24 hr tablet TAKE 1 TABLET (500 MG TOTAL) BY MOUTH DAILY WITH SUPPER. 01/11/17   Carlus Pavlov, MD  MICARDIS HCT 80-25 MG per tablet Take 1 tablet by mouth daily. 09/05/11   [provider]  Multiple Vitamins-Minerals (WOMENS MULTIVITAMIN PO) Take by mouth daily.    [provider]  omeprazole (PRILOSEC) 20 MG capsule Take 20 mg by mouth daily.    [provider]  Dola Argyle LANCETS 33G MISC  11/15/15   [provider]  [facility] VERIO test strip  11/15/15   [provider]    Allergies Sulfa antibiotics  No family history on file.  Social History Social History   Tobacco Use  . Smoking status: Never Smoker  . Smokeless tobacco: Never Used  Substance Use Topics  . Alcohol use: Not on file  . Drug use: Not on file    Review of Systems  Constitutional: No fever/chills Eyes for head and facial injury.  Positive headache Eyes: No visual changes. ENT: No sore throat. Respiratory: Denies cough Genitourinary: Negative for dysuria. Musculoskeletal: Negative for back pain.  Positive for right knee pain Skin: Positive for abrasions on the face and laceration to the nose    ____________________________________________   PHYSICAL EXAM:  VITAL SIGNS: ED Triage Vitals [09/28/17 1316]  Enc Vitals Group     BP (!) 159/84     Pulse Rate 85     Resp 20  Temp 98.1 F (36.7 C)     Temp Source Oral     SpO2 100 %     Weight 155 lb (70.3 kg)     Height  (1.575 m)     Head Circumference      Peak Flow      Pain Score 7     Pain Loc      Pain Edu?      Excl. in GC?     Constitutional: Alert and oriented. Well appearing and in no acute distress.  Patient is laughing and talking. Eyes: Conjunctivae are normal.  Head: Positive for large abrasion on the forehead with a hematoma.  Positive for large abrasion across the bridge of the  nose with a small laceration. Nose: No congestion/rhinnorhea.  Positive for nasal bone tenderness and large abrasion with small laceration across the nose Mouth/Throat: Mucous membranes are moist.   Cardiovascular: Normal rate, regular rhythm.  Heart sounds are normal Respiratory: Normal respiratory effort.  No retractions, lungs are clear to all station GU: deferred Musculoskeletal: FROM all extremities, warm and well perfused.  The right knee is tender to palpation Neurologic:  Normal speech and language.  Cranial nerves II through XII grossly intact Skin:  Skin is warm, dry.. No rash noted.  Positive for abrasions and a tiny laceration to the nose Psychiatric: Mood and affect are normal. Speech and behavior are normal.  ____________________________________________   LABS (all labs ordered are listed, but only abnormal results are displayed)  Labs Reviewed - No data to display ____________________________________________   ____________________________________________  RADIOLOGY  CT of the head and facial bones shows no acute intracranial abnormality, positive for nasal bone fractures. X-ray of the right knee is negative for any acute abnormality ____________________________________________   PROCEDURES  Procedure(s) performed: Steri-Strips were applied by the nurse  Procedures    ____________________________________________   INITIAL IMPRESSION / ASSESSMENT AND PLAN / ED COURSE  Pertinent labs & imaging results that were available during my care of the patient were reviewed by me and considered in my medical decision making (see chart for details).  Patient is a 72 year old female who complains of a fall hitting her head on concrete prior to arrival.  She has a headache and a laceration across the nose with multiple abrasions.  She is also complaining of right knee pain.  She is not on blood thinners.  On physical exam patient has a large hematoma and abrasion on the  forehead, small laceration abrasion across the nose, tenderness to the nasal bones, tenderness to the right knee.  X-ray of the right knee is negative CT of the head is negative for any intracranial abnormality CT of the maxillofacial shows a nasal bone fracture  X-ray and CT results were explained to the patient and her husband.  The nurses to apply Steri-Strips to the laceration.  The patient was given instructions on how to care for the abrasions of the nasal bone fractures.  She can follow-up with the ENT if desired.  She is to take Tylenol or ibuprofen for pain as needed.  She states she understands comply with instructions.  If her headache is worsening she should return to the emergency department for further evaluation.  She was discharged in stable condition     As part of my medical decision making, I reviewed the following data within the electronic MEDICAL RECORD NUMBER Nursing notes reviewed and incorporated, Old chart reviewed, Radiograph reviewed x-rays as noted above, Notes from prior  ED visits and [place] Controlled Substance Database  ____________________________________________   FINAL CLINICAL IMPRESSION(S) / ED DIAGNOSES  Final diagnoses:  Open fracture of nasal bone, initial encounter  Injury of head, initial encounter  Fall, initial encounter      NEW MEDICATIONS STARTED DURING THIS VISIT:  Discharge Medication List as of 09/28/2017  3:14 PM    START taking these medications   Details  cephALEXin (KEFLEX) 500 MG capsule Take 1 capsule (500 mg total) by mouth 3 (three) times daily., Starting Fri 09/28/2017, Print         Note:  This document was prepared using Dragon voice recognition software and may include unintentional dictation errors.    Faythe Ghee, PA-C 09/28/17 1714    Emily Filbert, MD 09/29/17 [phone]

## 2017-09-28 NOTE — ED Triage Notes (Signed)
Presents via ems s/p fall  States she tripped in parking lot    [place] face first  Large hematoma to forehead and abrasion to bridge of nose .also having some pain to right knee  No loc

## 2017-09-28 NOTE — Discharge Instructions (Addendum)
Follow-up with your regular doctor if there are any signs of infection.  If you are unhappy with the way your nose looks in 1 week then follow-up with the ear nose and throat doctors for evaluation.  The Steri-Strips should stay in place for 3 to 5 days.  Apply Vaseline to the areas that are braised.  Once the areas have healed, you may use a product called Mederma to decrease scarring.  Apply ice to any areas that hurt.

## 2017-10-12 ENCOUNTER — Ambulatory Visit: Payer: Medicare Other | Admitting: Internal Medicine

## 2017-10-12 ENCOUNTER — Encounter: Payer: Self-pay | Admitting: Internal Medicine

## 2017-10-12 VITALS — BP 146/88 | HR 79 | Ht 62.5 in | Wt 157.4 lb

## 2017-10-12 DIAGNOSIS — R809 Proteinuria, unspecified: Secondary | ICD-10-CM

## 2017-10-12 DIAGNOSIS — E1165 Type 2 diabetes mellitus with hyperglycemia: Secondary | ICD-10-CM

## 2017-10-12 LAB — POCT GLYCOSYLATED HEMOGLOBIN (HGB A1C): Hemoglobin A1C: 9.2 % — AB (ref 4.0–5.6)

## 2017-10-12 MED ORDER — DULAGLUTIDE 0.75 MG/0.5ML ~~LOC~~ SOAJ
SUBCUTANEOUS | 5 refills | Status: DC
Start: 2017-10-12 — End: 2018-02-25

## 2017-10-12 NOTE — Progress Notes (Signed)
Patient ID: Sandra [name], female   DOB: 10-Jul-1945, 72 y.o.   MRN: [id number]   HPI: Sandra [name] is a 72 y.o.-year-old female, returning for f/u for DM2, dx in 2015, prev. Prediabetes, non-insulin-dependent, uncontrolled, without long term complications. Last visit 4 months ago.  She fell and broke her nose 09/2017. She was on ABx >> GI sxs >> stopped Metformin then.  She is interested in restarting the medication.  At last visit, we had to stop Trulicity due to rash.  She is not completely convinced that the rash was from Trulicity.  We restarted Januvia then.  Since last visit, she saw a dermatologist who advised her that the rash was not from Trulicity.  She is interested in restarting the medication.  Last hemoglobin A1c was: Lab Results  Component Value Date   HGBA1C 7.4 06/13/2017   HGBA1C 7.4 03/13/2017   HGBA1C 7.5 12/11/2016  10/2015: HbA1c 8.2%  Pt is on a regimen of: - Metformin ER 500 mg daily with dinner -could not tolerate a higher dose >> now off for ~1 mo - Glipizide XL 2.5 mg in am - Januvia 100 mg in a.m. - restarted 06/2017  She was on Jardiance 10 mg daily in am - started 07/2016 >> could not tolerate it b/c increased urination and hyperglycemia  Stopped Metformin ER 500 mg bid in 06/2015 She was on Metformin 500 mg 1x a day with dinner (500 mg tid >> AP/diarrhea)  >> stopped when started Januvia We tried Trulicity >> rash on arms and legs..  Pt checks her sugars  0-1 X a day, but not lately as meter broke - 2 weeks ago: - am: 98, 147-203, 242 >> 87, 115-187, 196, 234 >> 180-190 - 2h after b'fast: n/c >> 170s - before lunch: 178 >> n/c >> 235 >> n/c - 2h after lunch:   231 >> 150s >> 115 >> n/c - before dinner: 128-165 >> n/c >> 196 >> n/c - 2h after dinner: n/c >> 137 >> n/c - bedtime:2 103-148 >> 196 >> n/c - nighttime: n/c >> 169-208 >> n/c Is unclear at which level she has hypoglycemia awareness. Highest CBG: 200 >> 240.  Glucometer:  OneTouch  Continues to go to [employer].  - No CKD, but has microalbuminuria, last BUN/creatinine and ACR:  01/12/2017: Glu 181, 18/0.8, eGFR 86, ACR 42.1 10/22/2015: 21/0.79, ACR 35.6 Lab Results  Component Value Date   BUN 2 (L) 01/04/2010   BUN 4 (L) 01/03/2010   CREATININE 0.82 01/04/2010   CREATININE 0.79 01/03/2010   No results found for: MICRALBCREAT  On Micardis.  - + HL; last set of lipids: 01/12/2017: 199/380/41/82 10/22/2015: 226/201/48/138 No results found for: CHOL, HDL, LDLCALC, LDLDIRECT, TRIG, CHOLHDL  She is not on a statin.  - last eye exam was in 05/2017: No DR .  + increased intraocular pressure >> improved  -she sees Dr. Prudencio Burly.  - no numbness and tingling in her feet. Last foot exam was normal 10/2015.  ROS: Constitutional: + weight gain/no weight loss, no fatigue, no subjective hyperthermia, no subjective hypothermia Eyes: no blurry vision, no xerophthalmia ENT: no sore throat, no nodules palpated in throat, no dysphagia, no odynophagia, no hoarseness Cardiovascular: no CP/no SOB/no palpitations/no leg swelling Respiratory: no cough/no SOB/no wheezing Gastrointestinal: no N/no V/no D/no C/no acid reflux Musculoskeletal: no muscle aches/no joint aches Skin: no rashes, no hair loss Neurological: no tremors/no numbness/no tingling/no dizziness  I reviewed pt's medications, allergies, PMH, social hx, family  hx, and changes were documented in the history of present illness. Otherwise, unchanged from my initial visit note.  Past medical history: Patient Active Problem List   Diagnosis Date Noted  . Microalbuminuria 12/11/2016    Priority: High  . Type 2 diabetes mellitus with hyperglycemia ([place]) 12/15/2015    Priority: High  . 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 Cervical pain 12/15/2015  . Diverticulitis of colon 12/15/2015  . Acid reflux 12/15/2015  . Essential (primary) hypertension 12/15/2015  . Benign breast lumps 12/15/2015   . Knee pain 11/08/2011   Social History   Social History  . Marital status: Married    Spouse name: N/A  . Number of children: 2   Occupational History  . Retired    Social History Main Topics  . Smoking status: Never Smoker  . Smokeless tobacco: Never Used  . Alcohol use Socially   . Drug use: No    Current Outpatient Medications on File Prior to Visit  Medication Sig Dispense Refill  . atorvastatin (LIPITOR) 10 MG tablet     . cephALEXin (KEFLEX) 500 MG capsule Take 1 capsule (500 mg total) by mouth 3 (three) times daily. 21 capsule 0  . glipiZIDE (GLUCOTROL XL) 2.5 MG 24 hr tablet Take 1 tablet (2.5 mg total) daily with breakfast by mouth. 90 tablet 2  . Insulin Pen Needle 32G X 4 MM MISC Use to inject insulin weekly 100 each 5  . JANUVIA 100 MG tablet TAKE 1 TABLET BY MOUTH EVERY DAY 90 tablet 3  . latanoprost (XALATAN) 0.005 % ophthalmic solution Place 1 drop into both eyes at bedtime.    . metFORMIN (GLUCOPHAGE-XR) 500 MG 24 hr tablet TAKE 1 TABLET (500 MG TOTAL) BY MOUTH DAILY WITH SUPPER. 90 tablet 3  . MICARDIS HCT 80-25 MG per tablet Take 1 tablet by mouth daily.    . Multiple Vitamins-Minerals (WOMENS MULTIVITAMIN PO) Take by mouth daily.    Marland Kitchen omeprazole (PRILOSEC) 20 MG capsule Take 20 mg by mouth daily.    [name] DELICA LANCETS 57W MISC     . ONETOUCH VERIO test strip      No current facility-administered medications on file prior to visit.     Allergies  Allergen Reactions  . Sulfa Antibiotics Rash   No family history on file.  PE: BP (!) 146/88   Pulse 79   Ht 5' 2.5" (1.588 m)   Wt 157 lb 6.4 oz (71.4 kg)   SpO2 97%   BMI 28.33 kg/m  Wt Readings from Last 3 Encounters:  10/12/17 157 lb 6.4 oz (71.4 kg)  09/28/17 155 lb (70.3 kg)  06/13/17 154 lb (69.9 kg)   Constitutional: overweight, in NAD Eyes: PERRLA, EOMI, no exophthalmos ENT: moist mucous membranes, no thyromegaly, no cervical lymphadenopathy Cardiovascular: RRR, No  MRG Respiratory: CTA B Gastrointestinal: abdomen soft, NT, ND, BS+ Musculoskeletal: no deformities, strength intact in all 4 Skin: moist, warm, no rashes Neurological: no tremor with outstretched hands, DTR normal in all 4  ASSESSMENT: 1. DM2, non-insulin-dependent, uncontrolled, without long term complications, but with hyperglycemia  2. Microalbuminuria  PLAN:  1. Patient with long-standing, uncontrolled, type 2 diabetes, on metformin, glipizide, and previously on Trulicity which was working great for her, but she had to stop due to rash.  It is unclear whether the rash was caused by Trulicity.  We discussed about this and she would be willing to give it another try after her rash completely resolved.  At last visit,  we restarted Januvia and I also advised her to start checking sugars later in the day. - Sugars are higher at this visit after she stopped metformin and since she is off Trulicity.  She is interested in restarting both medications.  We will do so today.  We discussed that she should contact me in 3 weeks to send a new prescription for the higher dose of Trulicity, but for now, we will start 0.75 mg weekly.  She tolerated this very well in the past, without GI symptoms.  We will start metformin at the previous low dose, of 500 mg with dinner.  If sugars not improving on the high dose Trulicity, metformin, and glipizide, will need to increase glipizide. - I suggested to:  Patient Instructions  Please restart: - Metformin ER 500 mg daily with dinner  Continue: - Glipizide XL 2.5 mg in am  Please stop: - Januvia  Start: - Trulicity 1.28 mg weekly  Please return in 4 months with your sugar log.   - today, HbA1c is 9.4% (higher!) - continue checking sugars at different times of the day - check 1x a day, rotating checks - advised for yearly eye exams >> she is UTD - Return to clinic in 4 mo with sugar log    2. Microalbuminuria - likely 2/2 DM - continue  Micardis  Philemon Kingdom, MD PhD [facility] Endocrinology

## 2017-10-12 NOTE — Addendum Note (Signed)
Addended by: Yolande JollyLAWSON, Rayette Mogg on: 10/12/2017 01:11 PM   Modules accepted: Orders

## 2017-10-12 NOTE — Patient Instructions (Addendum)
Please restart: - Metformin ER 500 mg daily with dinner  Continue: - Glipizide XL 2.5 mg in am  Please stop: - Januvia  Start: - Trulicity 0.75 mg weekly  Please return in 4 months with your sugar log.

## 2018-01-08 ENCOUNTER — Other Ambulatory Visit (HOSPITAL_COMMUNITY): Payer: Self-pay | Admitting: Internal Medicine

## 2018-01-08 DIAGNOSIS — I6523 Occlusion and stenosis of bilateral carotid arteries: Secondary | ICD-10-CM

## 2018-01-09 ENCOUNTER — Ambulatory Visit (HOSPITAL_COMMUNITY)
Admission: RE | Admit: 2018-01-09 | Discharge: 2018-01-09 | Disposition: A | Discharge status: Home / Self Care | Payer: Medicare Other | Source: Ambulatory Visit | Attending: Cardiovascular Disease | Admitting: Cardiovascular Disease

## 2018-01-09 DIAGNOSIS — I6523 Occlusion and stenosis of bilateral carotid arteries: Secondary | ICD-10-CM

## 2018-02-25 ENCOUNTER — Encounter: Payer: Self-pay | Admitting: Internal Medicine

## 2018-02-25 ENCOUNTER — Ambulatory Visit: Payer: Medicare Other | Admitting: Internal Medicine

## 2018-02-25 VITALS — BP 160/100 | HR 84 | Ht 62.5 in | Wt 153.0 lb

## 2018-02-25 DIAGNOSIS — E1165 Type 2 diabetes mellitus with hyperglycemia: Secondary | ICD-10-CM | POA: Diagnosis not present

## 2018-02-25 DIAGNOSIS — R809 Proteinuria, unspecified: Secondary | ICD-10-CM

## 2018-02-25 DIAGNOSIS — E785 Hyperlipidemia, unspecified: Secondary | ICD-10-CM | POA: Diagnosis not present

## 2018-02-25 LAB — POCT GLYCOSYLATED HEMOGLOBIN (HGB A1C): Hemoglobin A1C: 8.2 % — AB (ref 4.0–5.6)

## 2018-02-25 MED ORDER — GLIPIZIDE ER 2.5 MG PO TB24: 2.5000 mg | ORAL_TABLET | Freq: Every day | ORAL | 3 refills | Status: DC

## 2018-02-25 MED ORDER — DULAGLUTIDE 1.5 MG/0.5ML ~~LOC~~ SOAJ: 1.5000 mg | SUBCUTANEOUS | 11 refills | Status: DC

## 2018-02-25 NOTE — Patient Instructions (Addendum)
Please continue: - Glipizide XL 2.5 mg in am  Please increase: - Metformin ER 500 mg daily at bedtime  Please increase: - Trulicity 1.5 mg weekly  Please return in 4 months with your sugar log.

## 2018-02-25 NOTE — Addendum Note (Signed)
Addended by: Darliss Ridgel I on: 02/25/2018 09:05 AM   Modules accepted: Orders

## 2018-02-25 NOTE — Progress Notes (Signed)
Patient ID: Sandra [name], female   DOB: Dec 11, 1945, 72 y.o.   MRN: [id number]   HPI: Sandra [name] is a 72 y.o.-year-old female, returning for f/u for DM2, dx in 2015, prev. Prediabetes, non-insulin-dependent, uncontrolled, with long term complications (MAU). Last visit 4 months ago.  She fell and broke her nose 09/2017. She was on ABx >> GI sxs >> stopped Metformin then and was off the medication at last visit.  We restarted it then.  Earlier this year, we had to stop Trulicity because of a rash.  We switched her to Januvia then.  However, the rash proved not to be from Trulicity and we restarted this at last visit.  She does not have any side effects from it.  She has an upper respiratory infection and was recently on antibiotics.  She also had steroids.  Last hemoglobin A1c was: Lab Results  Component Value Date   HGBA1C 9.2 (A) 10/12/2017   HGBA1C 7.4 06/13/2017   HGBA1C 7.4 03/13/2017  10/2015: HbA1c 8.2%  Pt is on a regimen of:  -could not tolerate a higher dose. Restarted at last visit, but stopped it when on ABx - 1 mo ago - Glipizide XL 2.5 mg in am  >> Trulicity 6.81 mg weekly  - started 10/2017 She was on Jardiance 10 mg daily in am - started 07/2016 >> could not tolerate it b/c increased urination and hyperglycemia  Stopped Metformin ER 500 mg bid in 06/2015 She was on Metformin 500 mg 1x a day with dinner (500 mg tid >> AP/diarrhea)  >> stopped when started Januvia We tried Trulicity >> rash on arms and legs..  Pt checks her sugars  1x a day: - am: 87, 115-187, 196, 234 >> 180-190 >> 148, 165 - 2h after b'fast: n/c >> 170s - before lunch: 178 >> n/c >> 235 >> n/c - 2h after lunch:   231 >> 150s >> 115 >> n/c - before dinner: 128-165 >> n/c >> 196 >> n/c - 2h after dinner: n/c >> 137 >> n/c - bedtime:2 103-148 >> 196 >> n/c - nighttime: n/c >> 169-208 >> n/c Is unclear at which level she has hypoglycemia awareness. Highest CBG: 200 >> 240.  Glucometer:  OneTouch  Continues to go to [employer].  - No CKD, but + MAU, last BUN/creatinine and ACR:  01/12/2017: Glu 181, 18/0.8, eGFR 86, ACR 42.1 10/22/2015: 21/0.79, ACR 35.6 Lab Results  Component Value Date   BUN 2 (L) 01/04/2010   BUN 4 (L) 01/03/2010   CREATININE 0.82 01/04/2010   CREATININE 0.79 01/03/2010   No results found for: MICRALBCREAT  On Micardis.  - + HL; last set of lipids: 01/12/2017: 199/380/41/82 10/22/2015: 226/201/48/138 No results found for: CHOL, HDL, LDLCALC, LDLDIRECT, TRIG, CHOLHDL  Not on a statin.  - last eye exam was in 05/2017: No DR but increased IO pressure >> improved -she sees Sandra [name].  - no numbness and tingling in her feet. Last foot exam was normal 10/2015.  ROS: Constitutional: no weight gain/no weight loss, no fatigue, no subjective hyperthermia, no subjective hypothermia Eyes: no blurry vision, no xerophthalmia ENT: no sore throat, + nodules palpated in upper neck, no dysphagia, no odynophagia, no hoarseness, + postnasal drip Cardiovascular: no CP/no SOB/no palpitations/no leg swelling Respiratory: no cough/no SOB/no wheezing Gastrointestinal: no N/no V/no D/no C/no acid reflux Musculoskeletal: no muscle aches/no joint aches Skin: no rashes, no hair loss Neurological: no tremors/no numbness/no tingling/no dizziness  I reviewed pt's medications, allergies,  PMH, social hx, family hx, and changes were documented in the history of present illness. Otherwise, unchanged from my initial visit note.   Past medical history: Patient Active Problem List   Diagnosis Date Noted  . Microalbuminuria 12/11/2016    Priority: High  . Type 2 diabetes mellitus with hyperglycemia ([place]) 12/15/2015    Priority: High  . 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 Cervical pain 12/15/2015  . Diverticulitis of colon 12/15/2015  . Acid reflux 12/15/2015  . Essential (primary) hypertension 12/15/2015  . Benign breast lumps 12/15/2015  . Knee  pain 11/08/2011   Social History   Social History  . Marital status: Married    Spouse name: N/A  . Number of children: 2   Occupational History  . Retired    Social History Main Topics  . Smoking status: Never Smoker  . Smokeless tobacco: Never Used  . Alcohol use Socially   . Drug use: No    Current Outpatient Medications on File Prior to Visit  Medication Sig Dispense Refill  . atorvastatin (LIPITOR) 10 MG tablet     . cephALEXin (KEFLEX) 500 MG capsule Take 1 capsule (500 mg total) by mouth 3 (three) times daily. 21 capsule 0  . Dulaglutide (TRULICITY) 6.06 TK/1.6WF SOPN Inject 0.75 mg in am weekly under skin 4 pen 5  . glipiZIDE (GLUCOTROL XL) 2.5 MG 24 hr tablet Take 1 tablet (2.5 mg total) daily with breakfast by mouth. 90 tablet 2  . Insulin Pen Needle 32G X 4 MM MISC Use to inject insulin weekly 100 each 5  . latanoprost (XALATAN) 0.005 % ophthalmic solution Place 1 drop into both eyes at bedtime.    . metFORMIN (GLUCOPHAGE-XR) 500 MG 24 hr tablet TAKE 1 TABLET (500 MG TOTAL) BY MOUTH DAILY WITH SUPPER. 90 tablet 3  . MICARDIS HCT 80-25 MG per tablet Take 1 tablet by mouth daily.    . Multiple Vitamins-Minerals (WOMENS MULTIVITAMIN PO) Take by mouth daily.    Marland Kitchen omeprazole (PRILOSEC) 20 MG capsule Take 20 mg by mouth daily.    [name] DELICA LANCETS 09N MISC     . ONETOUCH VERIO test strip      No current facility-administered medications on file prior to visit.     Allergies  Allergen Reactions  . Sulfa Antibiotics Rash   No family history on file.  PE: BP (!) 160/100   Pulse 84   Ht 5' 2.5" (1.588 m) Comment: measured  Wt 153 lb (69.4 kg)   SpO2 98%   BMI 27.54 kg/m  Wt Readings from Last 3 Encounters:  02/25/18 153 lb (69.4 kg)  10/12/17 157 lb 6.4 oz (71.4 kg)  09/28/17 155 lb (70.3 kg)   Constitutional: overweight, in NAD Eyes: PERRLA, EOMI, no exophthalmos ENT: moist mucous membranes, no thyromegaly, + bilateral upper cervical  lymphadenopathy Cardiovascular: RRR, No MRG Respiratory: CTA B Gastrointestinal: abdomen soft, NT, ND, BS+ Musculoskeletal: no deformities, strength intact in all 4 Skin: moist, warm, no rashes Neurological: no tremor with outstretched hands, DTR normal in all 4  ASSESSMENT: 1. DM2, non-insulin-dependent, uncontrolled, without long term complications, but with hyperglycemia  2. Microalbuminuria  3. HL  PLAN:  1. Patient with long-standing, uncontrolled, type 2 diabetes, on metformin, glipizide, and now back on Trulicity.  She had a rash while on Trulicity before however after she saw dermatology, it was proven that this was not from Trulicity and she restarted this medication at last visit.  No new rashes.  She  was also off metformin at last visit and we restarted at the very low dose, 500 mg daily, as she could not tolerate higher doses in the past.  She started this, but stopped while on antibiotics. - At this visit, her sugars are better, but she only checked twice in the last month.  Discussed about the need to start checking more frequently.  We discussed about restarting metformin at bedtime, which she agrees with (she feels that she would be more compliant with it if taken at bedtime compared to dinnertime).  We also tried to increase Trulicity to 1.5 mg weekly.  She does not have side effects from this. - I suggested to:  Patient Instructions  Please continue: - Glipizide XL 2.5 mg in am  Please increase: - Metformin ER 500 mg daily at bedtime  Please increase: - Trulicity 1.5 mg weekly  Please return in 4 months with your sugar log.   - today, HbA1c is 8.2% (better) - continue checking sugars at different times of the day - check 1x a day, rotating checks - advised for yearly eye exams >> she is UTD - Return to clinic in 4 mo with sugar log     2. Microalbuminuria - likely 2/2 DM - continue Micardis - Need to obtain recent labs by PCP  3. HL - Reviewed latest lipid  panel from 01/2017: High triglycerides, but LDL at goal - She is not on a statin - She had another check by PCP recently-we will need to obtain records  Philemon Kingdom, MD PhD [facility] End[facility]

## 2018-07-01 ENCOUNTER — Ambulatory Visit: Payer: Medicare Other | Admitting: Internal Medicine

## 2018-07-01 ENCOUNTER — Encounter: Payer: Self-pay | Admitting: Internal Medicine

## 2018-07-01 VITALS — BP 132/80 | HR 81 | Ht 62.5 in | Wt 143.0 lb

## 2018-07-01 DIAGNOSIS — E785 Hyperlipidemia, unspecified: Secondary | ICD-10-CM | POA: Diagnosis not present

## 2018-07-01 DIAGNOSIS — R809 Proteinuria, unspecified: Secondary | ICD-10-CM

## 2018-07-01 DIAGNOSIS — E1165 Type 2 diabetes mellitus with hyperglycemia: Secondary | ICD-10-CM

## 2018-07-01 LAB — POCT GLYCOSYLATED HEMOGLOBIN (HGB A1C): Hemoglobin A1C: 7.6 % — AB (ref 4.0–5.6)

## 2018-07-01 MED ORDER — METFORMIN HCL ER 500 MG PO TB24: 500.0000 mg | ORAL_TABLET | Freq: Every day | ORAL | 3 refills | Status: DC

## 2018-07-01 NOTE — Addendum Note (Signed)
Addended by: Darliss Ridgel I on: 07/01/2018 09:47 AM   Modules accepted: Orders

## 2018-07-01 NOTE — Progress Notes (Signed)
Patient ID: Sandra [name], female   DOB: 04/24/1946, 73 y.o.   MRN: [id number]   HPI: Sandra [name] is a 73 y.o.-year-old female, returning for f/u for DM2, dx in 2015, prev. Prediabetes, non-insulin-dependent, uncontrolled, with long term complications (MAU). Last visit 4 months ago.  Last hemoglobin A1c was: Lab Results  Component Value Date   HGBA1C 8.2 (A) 02/25/2018   HGBA1C 9.2 (A) 10/12/2017   HGBA1C 7.4 06/13/2017  10/2015: HbA1c 8.2%  Pt is on a regimen of:  - Glipizide XL 2.5 mg in am - Metformin ER 500 mg daily at bedtime - Trulicity 1.5 mg weekly-increased 02/2018  She was on Jardiance 10 mg daily in am - started 07/2016 >> could not tolerate it b/c increased urination and hyperglycemia  Stopped Metformin ER 500 mg bid in 06/2015 She was on Metformin 500 mg 1x a day with dinner (500 mg tid >> AP/diarrhea)  >> stopped when started Januvia We tried Trulicity >> rash on arms and legs..  Pt checks her sugars once a day: - am:  180-190 >> 148, 165 >> 161-163, 187 - 2h after b'fast: n/c >> 170s - before lunch: 178 >> n/c >> 235 >> n/c - 2h after lunch:   231 >> 150s >> 115 >> n/c - before dinner: 128-165 >> n/c >> 196 >> n/c - 2h after dinner: n/c >> 137 >> n/c - bedtime:2 103-148 >> 196 >> n/c - nighttime: n/c >> 169-208 >> n/c It is unclear at which level she has hypoglycemia awareness Highest CBG: 200 >> 240 >> 187.  Glucometer: OneTouch  -No CKD but + MAU, last BUN/creatinine and ACR:  07/04/2017: 21/0.73, ACR not checked 01/12/2017: Glu 181, 18/0.8, eGFR 86, ACR 42.1 10/22/2015: 21/0.79, ACR 35.6 Lab Results  Component Value Date   BUN 2 (L) 01/04/2010   BUN 4 (L) 01/03/2010   CREATININE 0.82 01/04/2010   CREATININE 0.79 01/03/2010   No results found for: MICRALBCREAT  On Micardis.  -+ HL; last set of lipids: 07/04/2017: 207/381/40/91 01/12/2017: 199/380/41/82 10/22/2015: 226/201/48/138 No results found for: CHOL, HDL, LDLCALC, LDLDIRECT, TRIG,  CHOLHDL  She is not on a statin.  - last eye exam was in 05/2018: No DR, but increased intraocular pressure, which has worsened-she sees Dr. Prudencio Burly. New eye drops.  She will see Dr. Prudencio Burly again today.  -She denies numbness and tingling in her feet.   Latest TSH reviewed from 07/04/2017: 1.42.  Her daughter is getting married in June.  She lives in [place] but the wedding will be here.  ROS: Constitutional: + Weight loss, no fatigue, no subjective hyperthermia, no subjective hypothermia Eyes: no blurry vision, no xerophthalmia ENT: no sore throat, no nodules palpated in neck, no dysphagia, no odynophagia, no hoarseness Cardiovascular: no CP/no SOB/no palpitations/no leg swelling Respiratory: no cough/no SOB/no wheezing Gastrointestinal: no N/no V/no D/no C/no acid reflux Musculoskeletal: no muscle aches/no joint aches Skin: no rashes, no hair loss Neurological: no tremors/no numbness/no tingling/no dizziness  I reviewed pt's medications, allergies, PMH, social hx, family hx, and changes were documented in the history of present illness. Otherwise, unchanged from my initial visit note.  She started Combigan eyedrops.  Past medical history: Patient Active Problem List   Diagnosis Date Noted  . Microalbuminuria 12/11/2016    Priority: High  . Type 2 diabetes mellitus with hyperglycemia ([place]) 12/15/2015    Priority: High  . Dyslipidemia 02/25/2018    Priority: Medium  . 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 Cervical pain  12/15/2015  . Diverticulitis of colon 12/15/2015  . Acid reflux 12/15/2015  . Essential (primary) hypertension 12/15/2015  . Benign breast lumps 12/15/2015  . Knee pain 11/08/2011   Social History   Social History  . Marital status: Married    Spouse name: N/A  . Number of children: 2   Occupational History  . Retired    Social History Main Topics  . Smoking status: Never Smoker  . Smokeless tobacco: Never Used  . Alcohol use Socially   . Drug  use: No    Current Outpatient Medications on File Prior to Visit  Medication Sig Dispense Refill  . atorvastatin (LIPITOR) 10 MG tablet     . cephALEXin (KEFLEX) 500 MG capsule Take 1 capsule (500 mg total) by mouth 3 (three) times daily. 21 capsule 0  . Dulaglutide (TRULICITY) 1.5 YW/7.3XT SOPN Inject 1.5 mg into the skin once a week. 4 pen 11  . glipiZIDE (GLUCOTROL XL) 2.5 MG 24 hr tablet Take 1 tablet (2.5 mg total) by mouth daily with breakfast. 90 tablet 3  . Insulin Pen Needle 32G X 4 MM MISC Use to inject insulin weekly 100 each 5  . latanoprost (XALATAN) 0.005 % ophthalmic solution Place 1 drop into both eyes at bedtime.    . metFORMIN (GLUCOPHAGE-XR) 500 MG 24 hr tablet TAKE 1 TABLET (500 MG TOTAL) BY MOUTH DAILY WITH SUPPER. 90 tablet 3  . MICARDIS HCT 80-25 MG per tablet Take 1 tablet by mouth daily.    . Multiple Vitamins-Minerals (WOMENS MULTIVITAMIN PO) Take by mouth daily.    Marland Kitchen omeprazole (PRILOSEC) 20 MG capsule Take 20 mg by mouth daily.    [name] DELICA LANCETS 06Y MISC     . ONETOUCH VERIO test strip      No current facility-administered medications on file prior to visit.     Allergies  Allergen Reactions  . Sulfa Antibiotics Rash   No family history on file.  PE: BP 132/80   Pulse 81   Ht 5' 2.5" (1.588 m)   Wt 143 lb (64.9 kg)   SpO2 98%   BMI 25.74 kg/m  Wt Readings from Last 3 Encounters:  07/01/18 143 lb (64.9 kg)  02/25/18 153 lb (69.4 kg)  10/12/17 157 lb 6.4 oz (71.4 kg)   Constitutional: Normal  weight, in NAD Eyes: PERRLA, EOMI, no exophthalmos ENT: moist mucous membranes, no thyromegaly, no cervical lymphadenopathy Cardiovascular: RRR, No MRG Respiratory: CTA B Gastrointestinal: abdomen soft, NT, ND, BS+ Musculoskeletal: no deformities, strength intact in all 4 Skin: moist, warm, no rashes Neurological: no tremor with outstretched hands, DTR normal in all 4  ASSESSMENT: 1. DM2, non-insulin-dependent, uncontrolled, without long term  complications, but with hyperglycemia  2. Microalbuminuria  3. HL  PLAN:  1. Patient with longstanding, uncontrolled, type 2 diabetes, on oral antidiabetic regimen and Trulicity.  She initially had a rash while on Trulicity, however, after she saw dermatology, it was proven that this was not from [place] and she restarted medication last year.  She is tolerating this well, without any new rashes.  She could not tolerate higher doses of metformin in the past, so at last visit I suggested to take only 1 tablet of metformin ER at bedtime.  At last visit, sugars were better, but she did not have many CBG checks.  Discussed about the need to start checking more frequently.  We will also increased Trulicity to 1.5 mg weekly. - At this visit, she lost 10 lbs!  I congratulated her. -Her sugars are high around the holidays.  Now, they are still high but she only has few checks in her downloaded glucometer report.  These are all checked at waking up and they are above target, mostly at 160s and 1 blood sugar at 187.  However, HbA1c is 7.6% (better) -She is planning to start going to the gym, and this is covered by her [employer].  In this case, we decided to continue the current regimen and have her come back in 4 months for a recheck.  We may need to intensify the treatment if HbA1c is not trending down at that time. - I suggested to:  Patient Instructions  Please continue: - Glipizide XL 2.5 mg in am - Metformin ER 500 mg daily at bedtime - Trulicity 1.5 mg weekly  Please return in 4 months with your sugar log.   - continue checking sugars at different times of the day - check 1x a day, rotating checks - advised for yearly eye exams >> she is UTD - Return to clinic in 4  mo with sugar log      2. Microalbuminuria -Likely secondary to diabetes -Continue Micardis -We will need to repeat this at next visit  3. HL - Reviewed latest lipid panel from 06/2017: High triglycerides,  but LDL lower than 100 -She is not on a statin  Philemon Kingdom, MD PhD [facility] Endocrinology

## 2018-07-01 NOTE — Patient Instructions (Signed)
Please continue: - Glipizide XL 2.5 mg in am - Metformin ER 500 mg daily at bedtime - Trulicity 1.5 mg weekly  Please return in 4 months with your sugar log.

## 2018-07-18 ENCOUNTER — Encounter: Payer: Self-pay | Admitting: Internal Medicine

## 2018-07-18 ENCOUNTER — Telehealth: Payer: Self-pay

## 2018-07-18 NOTE — Telephone Encounter (Signed)
Patient brought in a copy of recent UA, she is very concerned regarding the MA/CR Ratio and the urine microalbumin levels.  MA/CR Ratio 96.3  Microalbumin 5.39  Patient would like call back regarding this result. Copy given to Dr. Elvera Lennox, please advise.

## 2018-07-18 NOTE — Telephone Encounter (Signed)
1 elevated urine protein may not be necessarily indicative of kidney dysfunction.  Regardless, we need to keep a good diabetes control and she will need a repeat urine protein in 1 to 3 months.

## 2018-07-18 NOTE — Telephone Encounter (Signed)
Notified patient of message from Dr. Gherghe, patient expressed understanding and agreement. No further questions.  

## 2018-07-18 NOTE — Progress Notes (Signed)
Received labs from 07/09/2018: -BMP normal with the exception of a glucose of 173.  BUN/creatinine 13/0.75, GFR 92. -Lipids: 195/250/40/106 -B12 302 -ACR 96.3 -TSH 1.2 -Vitamin D 14.5 -Urinalysis normal

## 2018-10-03 ENCOUNTER — Telehealth: Payer: Self-pay | Admitting: Internal Medicine

## 2018-10-03 NOTE — Telephone Encounter (Signed)
Patient is having consistently higher blood sugars overnight and would like to have someone call her back at [phone] on how to manage this situation

## 2018-10-03 NOTE — Telephone Encounter (Signed)
The high blood sugars could definitely be due to lack of metformin.  Until they start increasing, she can take 2 tablets of the glipizide XL 2.5 mg in the morning and 1 before dinner.  Please tell her to crush or break in half the 1 before dinner.

## 2018-10-03 NOTE — Telephone Encounter (Signed)
Left message for patient to return my call.

## 2018-10-03 NOTE — Telephone Encounter (Signed)
Spoke to patient who states for the past 3 weeks she has not been on metformin because the manufacturer was changed and the pill shape changed, she had a lot of trouble swallowing it, kept getting stuck in her throat and causing her to throw it up.  Patient contacted the pharmacy and they were able to order the type she can swallow and has now been back on it for three days. Patient is concerned because her sugars have been in the 120-150 range before the metformin issue and over the 3 weeks it has been going up into the 200 range, today it was 277.  patient very worried and wants to know if it could be a result of being off the metformin? Patient states she is waking every day, watching her diet and has not a few more pounds.

## 2018-10-04 MED ORDER — ONETOUCH DELICA LANCETS 33G MISC: 11 refills | Status: DC

## 2018-10-04 NOTE — Telephone Encounter (Signed)
Notified patient of message from Dr. Elvera Lennox, patient expressed understanding and agreement. No further questions.  Patient will update [place] in a few weeks.

## 2018-11-07 ENCOUNTER — Other Ambulatory Visit: Payer: Self-pay

## 2018-11-11 ENCOUNTER — Other Ambulatory Visit: Payer: Self-pay

## 2018-11-11 ENCOUNTER — Encounter: Payer: Self-pay | Admitting: Internal Medicine

## 2018-11-11 ENCOUNTER — Ambulatory Visit (INDEPENDENT_AMBULATORY_CARE_PROVIDER_SITE_OTHER): Payer: Medicare Other | Admitting: Internal Medicine

## 2018-11-11 VITALS — BP 120/88 | HR 92 | Ht 62.5 in | Wt 146.0 lb

## 2018-11-11 DIAGNOSIS — R809 Proteinuria, unspecified: Secondary | ICD-10-CM | POA: Diagnosis not present

## 2018-11-11 DIAGNOSIS — E1165 Type 2 diabetes mellitus with hyperglycemia: Secondary | ICD-10-CM

## 2018-11-11 DIAGNOSIS — E785 Hyperlipidemia, unspecified: Secondary | ICD-10-CM | POA: Diagnosis not present

## 2018-11-11 LAB — MICROALBUMIN / CREATININE URINE RATIO
Creatinine,U: 193.2 mg/dL
Microalb Creat Ratio: 5.6 mg/g (ref 0.0–30.0)
Microalb, Ur: 10.8 mg/dL — ABNORMAL HIGH (ref 0.0–1.9)

## 2018-11-11 LAB — GLUCOSE, POCT (MANUAL RESULT ENTRY): POC Glucose: 141 mg/dl — AB (ref 70–99)

## 2018-11-11 LAB — POCT GLYCOSYLATED HEMOGLOBIN (HGB A1C): Hemoglobin A1C: 6.8 % — AB (ref 4.0–5.6)

## 2018-11-11 MED ORDER — GLIPIZIDE ER 2.5 MG PO TB24: 2.5000 mg | ORAL_TABLET | Freq: Two times a day (BID) | ORAL | 3 refills | Status: DC

## 2018-11-11 MED ORDER — ONETOUCH VERIO W/DEVICE KIT: PACK | 0 refills | Status: DC

## 2018-11-11 NOTE — Patient Instructions (Addendum)
Please change: - Glipizide XL 2.5  mg in am and 2.5 mg (crushed) before dinner  Continue: - Metformin ER 500 mg daily at bedtime - Trulicity 1.5 mg weekly  Please return in 4 months with your sugar log.

## 2018-11-11 NOTE — Progress Notes (Signed)
Patient ID: Sandra [name], female   DOB: 03-10-1946, 73 y.o.   MRN: [id number]   HPI: Sandra [name] is a 73 y.o.-year-old female, returning for f/u for DM2, dx in 2015, non-insulin-dependent, uncontrolled, with long term complications ([facility]). Last visit 4.5 months ago.  Last hemoglobin A1c was: Lab Results  Component Value Date   HGBA1C 7.6 (A) 07/01/2018   HGBA1C 8.2 (A) 02/25/2018   HGBA1C 9.2 (A) 10/12/2017  10/2015: HbA1c 8.2%  Pt is on a regimen of: - Glipizide XL 2.5 mg in am - Metformin ER 500 mg daily at bedtime - Trulicity 1.5 mg weekly She was on Jardiance 10 mg daily in am - started 07/2016 >> could not tolerate it b/c increased urination and hyperglycemia  Stopped Metformin ER 500 mg bid in 06/2015 She was on Metformin 500 mg 1x a day with dinner (500 mg tid >> AP/diarrhea)  >> stopped when started Januvia We tried Trulicity >> rash on arms and legs..  Pt checks her sugars once a day: - am:  180-190 >> 148, 165 >> 161-163, 187 >> 177-199 - 2h after b'fast: n/c >> 170s >> 135-161, 258 - before lunch: 178 >> n/c >> 235 >> n/c >> 125 - 2h after lunch:   231 >> 150s >> 115 >> n/c >> 142 - before dinner: 128-165 >> n/c >> 196 >> n/c - 2h after dinner: n/c >> 137 >> n/c - bedtime:2 103-148 >> 196 >> n/c - nighttime: n/c >> 169-208 >> n/c It is unclear at which level she has hypoglycemia awareness. Highest CBG: 200 >> 240 >> 187 >> 258 (?) - likely meter error.  Glucometer: OneTouch  -No decreased kidney function but + microalbuminuria, last BUN/creatinine and ACR:  07/09/2018: glucose of 173.  BUN/creatinine 13/0.75, GFR 92, ACR 96.3 07/04/2017: 21/0.73, ACR not checked 01/12/2017: Glu 181, 18/0.8, eGFR 86, ACR 42.1 10/22/2015: 21/0.79, ACR 35.6 Lab Results  Component Value Date   BUN 2 (L) 01/04/2010   BUN 4 (L) 01/03/2010   CREATININE 0.82 01/04/2010   CREATININE 0.79 01/03/2010   No results found for: MICRALBCREAT  On Micardia.  -+ HL; last set of  lipids: 07/09/2018: 195/250/40/106 07/04/2017: 207/381/40/91 01/12/2017: 199/380/41/82 10/22/2015: 226/201/48/138 No results found for: CHOL, HDL, LDLCALC, LDLDIRECT, TRIG, CHOLHDL  She is not on a statin.  - last eye exam was in 05/2018: No DR, but increased intraocular pressure, which has worsened-she sees Dr. Prudencio Burly.  She is on eyedrops.  -no numbness and tingling in her feet.   Latest TSH reviewed from 07/04/2017: 1.42.  Her daughter had to cancel her June wedding due to the coronavirus pandemic.  ROS: Constitutional: no weight gain/no weight loss, no fatigue, no subjective hyperthermia, no subjective hypothermia Eyes: no blurry vision, no xerophthalmia ENT: no sore throat, no nodules palpated in neck, no dysphagia, no odynophagia, no hoarseness Cardiovascular: no CP/no SOB/no palpitations/no leg swelling Respiratory: no cough/no SOB/no wheezing Gastrointestinal: no N/no V/no D/no C/no acid reflux Musculoskeletal: no muscle aches/no joint aches Skin: no rashes, no hair loss Neurological: no tremors/no numbness/no tingling/no dizziness  I reviewed pt's medications, allergies, PMH, social hx, family hx, and changes were documented in the history of present illness. Otherwise, unchanged from my initial visit note.  Past medical history: Patient Active Problem List   Diagnosis Date Noted  . Microalbuminuria 12/11/2016    Priority: High  . Type 2 diabetes mellitus with hyperglycemia ([place]) 12/15/2015    Priority: High  . Dyslipidemia 02/25/2018  Priority: Medium  . 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 Cervical pain 12/15/2015  . Diverticulitis of colon 12/15/2015  . Acid reflux 12/15/2015  . Essential (primary) hypertension 12/15/2015  . Benign breast lumps 12/15/2015  . Knee pain 11/08/2011   Social History   Social History  . Marital status: Married    Spouse name: N/A  . Number of children: 2   Occupational History  . Retired    Social History Main  Topics  . Smoking status: Never Smoker  . Smokeless tobacco: Never Used  . Alcohol use Socially   . Drug use: No    Current Outpatient Medications on File Prior to Visit  Medication Sig Dispense Refill  . Dulaglutide (TRULICITY) 1.5 ZR/0.0TM SOPN Inject 1.5 mg into the skin once a week. 4 pen 11  . glipiZIDE (GLUCOTROL XL) 2.5 MG 24 hr tablet Take 1 tablet (2.5 mg total) by mouth daily with breakfast. 90 tablet 3  . Insulin Pen Needle 32G X 4 MM MISC Use to inject insulin weekly 100 each 5  . latanoprost (XALATAN) 0.005 % ophthalmic solution Place 1 drop into both eyes at bedtime.    . metFORMIN (GLUCOPHAGE-XR) 500 MG 24 hr tablet Take 1 tablet (500 mg total) by mouth daily with supper. 90 tablet 3  . MICARDIS HCT 80-25 MG per tablet Take 1 tablet by mouth daily.    . Multiple Vitamins-Minerals (WOMENS MULTIVITAMIN PO) Take by mouth daily.    Marland Kitchen omeprazole (PRILOSEC) 20 MG capsule Take 20 mg by mouth daily.    [name] Delica Lancets 22Q MISC Use to check blood sugar two times a day. 100 each 11  . ONETOUCH VERIO test strip      No current facility-administered medications on file prior to visit.     Allergies  Allergen Reactions  . Sulfa Antibiotics Rash   No family history on file.  PE: BP 120/88   Pulse 92   Ht 5' 2.5" (1.588 m)   Wt 146 lb (66.2 kg)   SpO2 99%   BMI 26.28 kg/m  Wt Readings from Last 3 Encounters:  11/11/18 146 lb (66.2 kg)  07/01/18 143 lb (64.9 kg)  02/25/18 153 lb (69.4 kg)   Constitutional: Normal weight, in NAD Eyes: PERRLA, EOMI, no exophthalmos ENT: moist mucous membranes, no thyromegaly, no cervical lymphadenopathy Cardiovascular: RRR, No MRG Respiratory: CTA B Gastrointestinal: abdomen soft, NT, ND, BS+ Musculoskeletal: no deformities, strength intact in all 4 Skin: moist, warm, no rashes Neurological: no tremor with outstretched hands, DTR normal in all 4  ASSESSMENT: 1. DM2, non-insulin-dependent, uncontrolled, without long term  complications, but with hyperglycemia  2. Microalbuminuria  3. HL  PLAN:  1. Patient with longstanding, uncontrolled, type 2 diabetes, on oral antidiabetic regimen and Trulicity.  Since last visit, she was briefly off metformin due to a change in manufacturer and we increased her glipizide at that time as sugars are high.  -At last visit, she had lost 10 pounds and her HbA1c was lower, at 7.6%.  She was planning to start going to the gym.  We did not change her regimen. - at this visit, sugars are higher in the morning and they improved during the day.  However, we checked her blood sugars with our glucometer and with her and the difference was 40 mg/dL higher with her device.  I advised her to buy a new control solution (hers is expired) and also may need to buy new strips.  We will of  glipizide low-dose with dinner, whenever she has a large meal, but otherwise no changes are needed in her regimen. - I suggested to:  Patient Instructions  Please change: - Glipizide XL 2.5  mg in am and 2.5 mg (crushed) before dinner  Continue: - Metformin ER 500 mg daily at bedtime - Trulicity 1.5 mg weekly  Please return in 4 months with your sugar log.   - today, HbA1c is 6.8% (better) - continue checking sugars at different times of the day - check 1x a day, rotating checks - advised for yearly eye exams >> she is UTD - Return to clinic in 4 mo with sugar log      2. Microalbuminuria -Likely secondary to diabetes -continue Micardis -We will recheck her ACR today  3. HL -Reviewed latest lipid panel from 07/2018: Triglycerides high, LDL also above goal: 195/250/40/106 -She is not on a statin -She continues to improve her diet and starting to walk  Office Visit on 11/11/2018  Component Date Value Ref Range Status  . Microalb, Ur 11/11/2018 10.8* 0.0 - 1.9 mg/dL Final  . Creatinine,U 11/11/2018 193.2  mg/dL Final  . Microalb Creat Ratio 11/11/2018 5.6  0.0 - 30.0 mg/g Final  . Hemoglobin A1C  11/11/2018 6.8* 4.0 - 5.6 % Final  . POC Glucose 11/11/2018 141* 70 - 99 mg/dl Final   ACR normalized.  Philemon Kingdom, MD PhD [facility] Endocrinology

## 2018-12-31 ENCOUNTER — Other Ambulatory Visit: Payer: Self-pay | Admitting: Internal Medicine

## 2019-02-17 ENCOUNTER — Other Ambulatory Visit: Payer: Self-pay | Admitting: Internal Medicine

## 2019-02-17 DIAGNOSIS — Z1231 Encounter for screening mammogram for malignant neoplasm of breast: Secondary | ICD-10-CM

## 2019-03-11 ENCOUNTER — Other Ambulatory Visit: Payer: Self-pay

## 2019-03-13 ENCOUNTER — Other Ambulatory Visit: Payer: Self-pay

## 2019-03-13 ENCOUNTER — Ambulatory Visit (INDEPENDENT_AMBULATORY_CARE_PROVIDER_SITE_OTHER): Payer: Medicare Other | Admitting: Internal Medicine

## 2019-03-13 ENCOUNTER — Encounter: Payer: Self-pay | Admitting: Internal Medicine

## 2019-03-13 VITALS — BP 130/80 | HR 95 | Ht 62.5 in | Wt 150.0 lb

## 2019-03-13 DIAGNOSIS — R809 Proteinuria, unspecified: Secondary | ICD-10-CM | POA: Diagnosis not present

## 2019-03-13 DIAGNOSIS — E1165 Type 2 diabetes mellitus with hyperglycemia: Secondary | ICD-10-CM

## 2019-03-13 DIAGNOSIS — E785 Hyperlipidemia, unspecified: Secondary | ICD-10-CM

## 2019-03-13 LAB — POCT GLYCOSYLATED HEMOGLOBIN (HGB A1C): Hemoglobin A1C: 7 % — AB (ref 4.0–5.6)

## 2019-03-13 MED ORDER — TRULICITY 3 MG/0.5ML ~~LOC~~ SOAJ: 3.0000 mg | SUBCUTANEOUS | 11 refills | Status: DC

## 2019-03-13 MED ORDER — GLIPIZIDE ER 2.5 MG PO TB24: 2.5000 mg | ORAL_TABLET | Freq: Every day | ORAL | 3 refills | Status: DC

## 2019-03-13 MED ORDER — METFORMIN HCL ER 500 MG PO TB24: 500.0000 mg | ORAL_TABLET | Freq: Every day | ORAL | 3 refills | Status: DC

## 2019-03-13 NOTE — Patient Instructions (Addendum)
Please restart: - Metformin ER 500 mg at bedtime  Please stop: - Glipizide XL 2.5 mg in am   Continue: - Glipizide 2.5 mg (crushed) before a large dinner  Please increase: - Trulicity 3 mg weekly  Please return in 4 months with your sugar log.

## 2019-03-13 NOTE — Progress Notes (Signed)
Patient ID: Sandra [name], female   DOB: Feb 02, 1946, 73 y.o.   MRN: [id number]   HPI: Sandra [name] is a 73 y.o.-year-old female, returning for f/u for DM2, dx in 2015, non-insulin-dependent, uncontrolled, with long term complications ([facility]). Last visit 4 months ago.  Reviewed HbA1c levels: Lab Results  Component Value Date   HGBA1C 6.8 (A) 11/11/2018   HGBA1C 7.6 (A) 07/01/2018   HGBA1C 8.2 (A) 02/25/2018  10/2015: HbA1c 8.2%  Pt is on a regimen of: - e >> stopped 2/2 recall 3 weeks - Glipizide XL 2.5 mg in am and 2.5 mg (crushed) before dinner - Trulicity 1.5 mg weekly -no side effects, she loves this! She was on Jardiance 10 mg daily in am - started 07/2016 >> could not tolerate it b/c increased urination and hyperglycemia  She was on Metformin 500 mg 1x a day with dinner (500 mg tid >> AP/diarrhea)  >> stopped when started Januvia  Pt checks her sugars once a day: - am:   148, 165 >> 161-163, 187 >> 177-199 >> 135, 140, 150-180 - 2h after b'fast: n/c >> 170s >> 135-161, 258 >> n/c - before lunch: 178 >> n/c >> 235 >> n/c >> 125 >> 150s - 2h after lunch:   231 >> 150s >> 115 >> n/c >> 142 >> n/c - before dinner: 128-165 >> n/c >> 196 >> n/c - 2h after dinner: n/c >> 137 >> n/c - bedtime: 103-148 >> 196 >> n/c - nighttime: n/c >> 169-208 >> n/c It is unclear at which level she has hypoglycemia awareness. Highest CBG: 258 (?) - likely meter error >> 220  Glucometer: OneTouch  No CKD, last BUN/creatinine and ACR:  07/09/2018: glucose of 173.  BUN/creatinine 13/0.75, GFR 92, ACR 96.3 07/04/2017: 21/0.73, ACR not checked 01/12/2017: Glu 181, 18/0.8, eGFR 86, ACR 42.1 10/22/2015: 21/0.79, ACR 35.6 Lab Results  Component Value Date   BUN 2 (L) 01/04/2010   BUN 4 (L) 01/03/2010   CREATININE 0.82 01/04/2010   CREATININE 0.79 01/03/2010   Lab Results  Component Value Date   MICRALBCREAT 5.6 11/11/2018  On my card is  + HL; last set of lipids: 07/09/2018:  195/250/40/106 07/04/2017: 207/381/40/91 01/12/2017: 199/380/41/82 10/22/2015: 226/201/48/138 No results found for: CHOL, HDL, LDLCALC, LDLDIRECT, TRIG, CHOLHDL  Not on a statin.  - last eye exam was in 05/2018: No DR, but increased intraocular pressure, which worsened.  She is on eyedrops.  She sees Dr. Ottis Stain  -She denies numbness and tingling in her feet.   Latest TSH reviewed from 07/04/2017: 1.42.  Her daughter had to cancel her June wedding due to the coronavirus pandemic.  ROS: Constitutional: + weight gain/no weight loss, no fatigue, no subjective hyperthermia, no subjective hypothermia Eyes: no blurry vision, no xerophthalmia ENT: no sore throat, no nodules palpated in neck, no dysphagia, no odynophagia, no hoarseness Cardiovascular: no CP/no SOB/no palpitations/no leg swelling Respiratory: no cough/no SOB/no wheezing Gastrointestinal: no N/no V/no D/no C/no acid reflux Musculoskeletal: + muscle aches (left upper arm)/no joint aches Skin: no rashes, no hair loss Neurological: no tremors/no numbness/no tingling/no dizziness  I reviewed pt's medications, allergies, PMH, social hx, family hx, and changes were documented in the history of present illness. Otherwise, unchanged from my initial visit note.  Past medical history: Patient Active Problem List   Diagnosis Date Noted  . Microalbuminuria 12/11/2016    Priority: High  . Type 2 diabetes mellitus with hyperglycemia ([place]) 12/15/2015    Priority: High  .  Dyslipidemia 02/25/2018    Priority: Medium  . 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 Cervical pain 12/15/2015  . Diverticulitis of colon 12/15/2015  . Acid reflux 12/15/2015  . Essential (primary) hypertension 12/15/2015  . Benign breast lumps 12/15/2015  . Knee pain 11/08/2011   Social History   Social History  . Marital status: Married    Spouse name: N/A  . Number of children: 2   Occupational History  . Retired    Social History Main Topics   . Smoking status: Never Smoker  . Smokeless tobacco: Never Used  . Alcohol use Socially   . Drug use: No    Current Outpatient Medications on File Prior to Visit  Medication Sig Dispense Refill  . Blood Glucose Monitoring Suppl (ONETOUCH VERIO) w/Device KIT Use to check blood sugar daily 1 kit 0  . glipiZIDE (GLUCOTROL XL) 2.5 MG 24 hr tablet Take 1 tablet (2.5 mg total) by mouth 2 (two) times daily before a meal. 180 tablet 3  . Insulin Pen Needle 32G X 4 MM MISC Use to inject insulin weekly 100 each 5  . latanoprost (XALATAN) 0.005 % ophthalmic solution Place 1 drop into both eyes at bedtime.    . metFORMIN (GLUCOPHAGE-XR) 500 MG 24 hr tablet Take 1 tablet (500 mg total) by mouth daily with supper. 90 tablet 3  . MICARDIS HCT 80-25 MG per tablet Take 1 tablet by mouth daily.    . Multiple Vitamins-Minerals (WOMENS MULTIVITAMIN PO) Take by mouth daily.    Marland Kitchen omeprazole (PRILOSEC) 20 MG capsule Take 20 mg by mouth daily.    [name] Delica Lancets 30Q MISC Use to check blood sugar two times a day. 100 each 11  . ONETOUCH VERIO test strip     . TRULICITY 1.5 MV/7.8IO SOPN INJECT 1.5 MG INTO THE SKIN ONCE A WEEK. 6 pen 3   No current facility-administered medications on file prior to visit.     Allergies  Allergen Reactions  . Sulfa Antibiotics Rash   No family history on file.  PE: BP 130/80   Pulse 95   Ht 5' 2.5" (1.588 m)   Wt 150 lb (68 kg)   SpO2 99%   BMI 27.00 kg/m  Wt Readings from Last 3 Encounters:  03/13/19 150 lb (68 kg)  11/11/18 146 lb (66.2 kg)  07/01/18 143 lb (64.9 kg)   Constitutional: Normal weight, in NAD Eyes: PERRLA, EOMI, no exophthalmos ENT: moist mucous membranes, no thyromegaly, no cervical lymphadenopathy Cardiovascular: RRR, No MRG Respiratory: CTA B Gastrointestinal: abdomen soft, NT, ND, BS+ Musculoskeletal: no deformities, strength intact in all 4 Skin: moist, warm, no rashes Neurological: no tremor with outstretched hands, DTR normal  in all 4  ASSESSMENT: 1. DM2, non-insulin-dependent, uncontrolled, without long term complications, but with hyperglycemia  2. Microalbuminuria  3. HL  PLAN:  1. Patient with longstanding, uncontrolled disease, with better control lately on an oral antidiabetic regimen with metformin, sulfonylurea, and weekly GLP-1 receptor agonist.  At last visit, sugars are higher in the morning and they were improving during the day.  However, we checked her blood sugar with glucometer and compared with hers.  The difference was 40 mg/dL higher with her device.  We discussed about calibrating her meter and also possibly buying new strips.  We did not change the regimen at that time but did advise her to take 2.5 mg glipizide before larger dinner. -At this visit, sugars are still high in the morning but slightly improved  compared to before.  She did improve her diet since last visit but she is off Metformin so I am actually surprised that her sugars did improve at all.  Later in the day, they are slightly higher.  We discussed at this visit about restarting Metformin ER after a discussion about the recalled lots.  I also advised her to increase the Trulicity to 3 mg weekly and to stop the morning glipizide.  We will keep the glipizide before dinner, in case she has a larger meal - I suggested to:  Patient Instructions  Please restart: - Metformin ER 500 mg at bedtime  Please stop: - Glipizide XL 2.5 mg in am   Continue: - Glipizide 2.5 mg (crushed) before a large dinner  Please increase: - Trulicity 3 mg weekly  Please return in 4 months with your sugar log.   - we checked her HbA1c: 7.0% (higher) - advised to check sugars at different times of the day - 1-2x a day, rotating check times - advised for yearly eye exams >> she is UTD - return to clinic in 4 months     2. Microalbuminuria -Most likely secondary to diabetes -On Micardis, will continue this -Reviewed latest ACR from last visit and is  normalized  3. HL -Reviewed latest lipid panel from 07/2018: Triglycerides high, LDL also above goal: 195/250/40/106 -She is not on a statin -She is working on improving her diet and started walking   Philemon Kingdom, MD PhD [facility] Endocrinology

## 2019-03-13 NOTE — Addendum Note (Signed)
Addended by: Cardell Peach I on: 03/13/2019 10:13 AM   Modules accepted: Orders

## 2019-04-01 ENCOUNTER — Other Ambulatory Visit: Payer: Self-pay

## 2019-04-01 ENCOUNTER — Ambulatory Visit
Admission: RE | Admit: 2019-04-01 | Discharge: 2019-04-01 | Disposition: A | Discharge status: Home / Self Care | Payer: Medicare Other | Source: Ambulatory Visit | Attending: Internal Medicine | Admitting: Internal Medicine

## 2019-04-01 DIAGNOSIS — Z1231 Encounter for screening mammogram for malignant neoplasm of breast: Secondary | ICD-10-CM

## 2019-07-15 ENCOUNTER — Other Ambulatory Visit: Payer: Self-pay

## 2019-07-17 ENCOUNTER — Ambulatory Visit: Payer: Medicare PPO | Admitting: Internal Medicine

## 2019-07-17 ENCOUNTER — Other Ambulatory Visit: Payer: Self-pay

## 2019-07-17 ENCOUNTER — Encounter: Payer: Self-pay | Admitting: Internal Medicine

## 2019-07-17 VITALS — BP 126/70 | HR 92 | Ht 62.5 in | Wt 141.0 lb

## 2019-07-17 DIAGNOSIS — E785 Hyperlipidemia, unspecified: Secondary | ICD-10-CM | POA: Diagnosis not present

## 2019-07-17 DIAGNOSIS — E1165 Type 2 diabetes mellitus with hyperglycemia: Secondary | ICD-10-CM

## 2019-07-17 DIAGNOSIS — R809 Proteinuria, unspecified: Secondary | ICD-10-CM | POA: Diagnosis not present

## 2019-07-17 LAB — POCT GLYCOSYLATED HEMOGLOBIN (HGB A1C): Hemoglobin A1C: 8.1 % — AB (ref 4.0–5.6)

## 2019-07-17 MED ORDER — GLIPIZIDE ER 2.5 MG PO TB24: 2.5000 mg | ORAL_TABLET | Freq: Two times a day (BID) | ORAL | 3 refills | Status: DC

## 2019-07-17 NOTE — Progress Notes (Signed)
Patient ID: Sandra [name], female   DOB: 1945-06-03, 74 y.o.   MRN: [id number]   This visit occurred during the SARS-CoV-2 public health emergency.  Safety protocols were in place, including screening questions prior to the visit, additional usage of staff PPE, and extensive cleaning of exam room while observing appropriate contact time as indicated for disinfecting solutions.   HPI: Sandra [name] is a 74 y.o.-year-old female, returning for f/u for DM2, dx in 2015, non-insulin-dependent, uncontrolled, with long term complications ([facility]). Last visit 4 months ago.  Pt's daughter is now living with her for last 3 mo >> diet is worse.  She also had cataract sx since last OV.  She did not exercise at all since last OV.  She is also stressed because of her daughter's upcoming wedding.  Overall, sugars are worse.  Reviewed HbA1c levels: Lab Results  Component Value Date   HGBA1C 7.0 (A) 03/13/2019   HGBA1C 6.8 (A) 11/11/2018   HGBA1C 7.6 (A) 07/01/2018  10/2015: HbA1c 8.2%  Pt is on a regimen of: - Metformin ER 500 mg at bedtime >> stopped 2/2 recall 3 weeks >> restarted 500 mg with dinner 03/2019 - Glipizide XL 2.5 mg in am and 2.5 mg (crushed) before dinner >> 2.5 mg before dinner - not used since last OV - Trulicity 1.5 mg >> 3 mg weekly - no SEs She was on Jardiance 10 mg daily in am - started 07/2016 >> could not tolerate it b/c increased urination and hyperglycemia  She was on Metformin 500 mg 1x a day with dinner (500 mg tid >> AP/diarrhea)  >> stopped when started Januvia  Pt checks her sugars once a day: - am: 177-199 >> 135, 140, 150-180 >> 160-200, 212 - 2h after b'fast:  170s >> 135-161, 258 >> n/c - before lunch:  235 >> n/c >> 125 >> 150s >> 160-180 - 2h after lunch:  150s >> 115 >> n/c >> 142 >> n/c - before dinner: 128-165 >> n/c >> 196 >> n/c - 2h after dinner: n/c >> 137 >> n/c - bedtime: 103-148 >> 196 >> n/c >> 160-180 - nighttime: n/c >> 169-208 >> n/c It  is unclear at which level she had hypoglycemia awareness. Highest CBG: 258 (?) - likely meter error >> 220 >> 212.  Glucometer: OneTouch  No CKD, last BUN/creatinine and ACR:  07/09/2018: glucose of 173.  BUN/creatinine 13/0.75, GFR 92, ACR 96.3 07/04/2017: 21/0.73, ACR not checked 01/12/2017: Glu 181, 18/0.8, eGFR 86, ACR 42.1 10/22/2015: 21/0.79, ACR 35.6 Lab Results  Component Value Date   BUN 2 (L) 01/04/2010   BUN 4 (L) 01/03/2010   CREATININE 0.82 01/04/2010   CREATININE 0.79 01/03/2010   Lab Results  Component Value Date   MICRALBCREAT 5.6 11/11/2018  On Micardis.  + HL; last set of lipids: 07/09/2018: 195/250/40/106 07/04/2017: 207/381/40/91 01/12/2017: 199/380/41/82 10/22/2015: 226/201/48/138 No results found for: CHOL, HDL, LDLCALC, LDLDIRECT, TRIG, CHOLHDL  She is not on a statin.  - last eye exam was in 06/2019: No DR, but she had increased intraocular pressure, which worsened.  She is on eyedrops.  She sees Dr. Prudencio Burly. She had cataract sx.  - nonumbness and tingling in her feet.   Her daughter had to cancel her June 2020 wedding due to the coronavirus pandemic. She will have it this coming [place].  ROS: Constitutional: no weight gain/+ weight loss, no fatigue, no subjective hyperthermia, no subjective hypothermia Eyes: no blurry vision, no xerophthalmia ENT: no sore throat,  no nodules palpated in neck, no dysphagia, no odynophagia, no hoarseness Cardiovascular: no CP/no SOB/no palpitations/no leg swelling Respiratory: no cough/no SOB/no wheezing Gastrointestinal: no N/no V/no D/no C/no acid reflux Musculoskeletal: no muscle aches/no joint aches Skin: no rashes, no hair loss Neurological: no tremors/no numbness/no tingling/no dizziness  I reviewed pt's medications, allergies, PMH, social hx, family hx, and changes were documented in the history of present illness. Otherwise, unchanged from my initial visit note.  Past medical history: Patient Active Problem List    Diagnosis Date Noted  . Microalbuminuria 12/11/2016    Priority: High  . Type 2 diabetes mellitus with hyperglycemia ([place]) 12/15/2015    Priority: High  . Dyslipidemia 02/25/2018    Priority: Medium  . 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 Cervical pain 12/15/2015  . Diverticulitis of colon 12/15/2015  . Acid reflux 12/15/2015  . Essential (primary) hypertension 12/15/2015  . Benign breast lumps 12/15/2015  . Knee pain 11/08/2011   Social History   Social History  . Marital status: Married    Spouse name: N/A  . Number of children: 2   Occupational History  . Retired    Social History Main Topics  . Smoking status: Never Smoker  . Smokeless tobacco: Never Used  . Alcohol use Socially   . Drug use: No    Current Outpatient Medications on File Prior to Visit  Medication Sig Dispense Refill  . Blood Glucose Monitoring Suppl (ONETOUCH VERIO) w/Device KIT Use to check blood sugar daily 1 kit 0  . Dulaglutide (TRULICITY) 3 XA/1.2IN SOPN Inject 3 mg into the skin once a week. 4 pen 11  . glipiZIDE (GLUCOTROL XL) 2.5 MG 24 hr tablet Take 1 tablet (2.5 mg total) by mouth daily before supper. 90 tablet 3  . Insulin Pen Needle 32G X 4 MM MISC Use to inject insulin weekly 100 each 5  . latanoprost (XALATAN) 0.005 % ophthalmic solution Place 1 drop into both eyes at bedtime.    . metFORMIN (GLUCOPHAGE-XR) 500 MG 24 hr tablet Take 1 tablet (500 mg total) by mouth daily with supper. 90 tablet 3  . MICARDIS HCT 80-25 MG per tablet Take 1 tablet by mouth daily.    . Multiple Vitamins-Minerals (WOMENS MULTIVITAMIN PO) Take by mouth daily.    Marland Kitchen omeprazole (PRILOSEC) 20 MG capsule Take 20 mg by mouth daily.    [name] Delica Lancets 86V MISC Use to check blood sugar two times a day. 100 each 11  . ONETOUCH VERIO test strip      No current facility-administered medications on file prior to visit.    Allergies  Allergen Reactions  . Sulfa Antibiotics Rash   No family  history on file.  PE: BP 126/70   Pulse 92   Ht 5' 2.5" (1.588 m)   Wt 141 lb (64 kg)   SpO2 97%   BMI 25.38 kg/m  Wt Readings from Last 3 Encounters:  07/17/19 141 lb (64 kg)  03/13/19 150 lb (68 kg)  11/11/18 146 lb (66.2 kg)   Constitutional: normal weight, in NAD Eyes: PERRLA, EOMI, no exophthalmos ENT: moist mucous membranes, no thyromegaly, no cervical lymphadenopathy Cardiovascular: RRR, No MRG Respiratory: CTA B Gastrointestinal: abdomen soft, NT, ND, BS+ Musculoskeletal: no deformities, strength intact in all 4 Skin: moist, warm, no rashes Neurological: no tremor with outstretched hands, DTR normal in all 4  ASSESSMENT: 1. DM2, non-insulin-dependent, uncontrolled, without long term complications, but with hyperglycemia  2. Microalbuminuria  3. HL  PLAN:  1. Patient with longstanding, uncontrolled, type 2 diabetes, with better control lately on Metformin ER, sulfonylurea, and weekly GLP-1 receptor agonist, dose increased at last visit.  At that time, sugars were still high in the morning (as she stopped the Metformin ER due to recalls) but improved compared to before.  She also improved her diet.  At that time, we added back Metformin ER low-dose at dinnertime and also advised her to increase Trulicity.  We kept glipizide before dinner in case she had a larger meal, but we stopped her a.m. glipizide XL. - At this visit, she had multiple stressors in the last 4 months and her sugars are worse.  She is also not exercising and relaxed her diet.  States sugars are high at all times of the day, I again suggested long-acting insulin, but she would like to work on her diet and exercise first.  In the meantime, visit to restart glipizide in the morning and before dinner - I suggested to:  Patient Instructions  Please continue: - Metformin ER 500 mg at bedtime - Trulicity 3 mg weekly  Please restart: - Glipizide XL 2.5 mg before breakfast and (crushed) before dinner  Also,  start improving diet and restart exercise.  Please return in 3-4 months with your sugar log.   - we checked her HbA1c: 8.1% (higher) - advised to check sugars at different times of the day - 1x a day, rotating check times - advised for yearly eye exams >> she is UTD - She had annual lab by Dr. Inda Merlin recently-we will need to get records - return to clinic in 4 months    2. Microalbuminuria -Most likely secondary to diabetes -She continues on Micardis -Reviewed latest ACR which was high a year ago. -We will get the records from Dr. Arvella Nigh.  3. HL - Reviewed latest lipid panel from 07/2018: Triglycerides high, LDL above goal -She is not on a statin -She relaxed her diet since last visit exercise.  She is planning to restart.   Philemon Kingdom, MD PhD [facility] Endocrinology

## 2019-07-17 NOTE — Addendum Note (Signed)
Addended by: Darliss Ridgel I on: 07/17/2019 09:37 AM   Modules accepted: Orders

## 2019-07-17 NOTE — Patient Instructions (Addendum)
Please continue: - Metformin ER 500 mg at bedtime - Trulicity 3 mg weekly  Please restart: - Glipizide XL 2.5 mg before breakfast and (crushed) before a large dinner  Also, start improving diet and restart exercise.  Please return in 3-4 months with your sugar log.

## 2019-09-09 LAB — COLOGUARD: COLOGUARD: NEGATIVE

## 2019-09-18 DIAGNOSIS — H2000 Unspecified acute and subacute iridocyclitis: Secondary | ICD-10-CM | POA: Diagnosis not present

## 2019-11-04 ENCOUNTER — Ambulatory Visit: Payer: Medicare PPO | Admitting: Internal Medicine

## 2019-11-04 ENCOUNTER — Encounter: Payer: Self-pay | Admitting: Internal Medicine

## 2019-11-04 ENCOUNTER — Other Ambulatory Visit: Payer: Self-pay

## 2019-11-04 VITALS — BP 128/88 | HR 87 | Ht 62.5 in | Wt 137.0 lb

## 2019-11-04 DIAGNOSIS — R809 Proteinuria, unspecified: Secondary | ICD-10-CM

## 2019-11-04 DIAGNOSIS — E785 Hyperlipidemia, unspecified: Secondary | ICD-10-CM | POA: Diagnosis not present

## 2019-11-04 DIAGNOSIS — E1165 Type 2 diabetes mellitus with hyperglycemia: Secondary | ICD-10-CM

## 2019-11-04 LAB — POCT GLYCOSYLATED HEMOGLOBIN (HGB A1C): Hemoglobin A1C: 7.4 % — AB (ref 4.0–5.6)

## 2019-11-04 NOTE — Addendum Note (Signed)
Addended by: Darliss Ridgel I on: 11/04/2019 09:50 AM   Modules accepted: Orders

## 2019-11-04 NOTE — Patient Instructions (Addendum)
Please continue: - Metformin ER 500 mg but take it with dinner - Trulicity 3 mg weekly  Please return in 4 months with your sugar log.

## 2019-11-04 NOTE — Progress Notes (Signed)
Patient ID: [name], female   DOB: [birth year], 74 y.o.   MRN: [id number]   This visit occurred during the SARS-CoV-2 public health emergency.  Safety protocols were in place, including screening questions prior to the visit, additional usage of staff PPE, and extensive cleaning of exam room while observing appropriate contact time as indicated for disinfecting solutions.   HPI: Sandra [name] is a 74 y.o.-year-old female, returning for f/u for DM2, dx in 2015, non-insulin-dependent, uncontrolled, with long term complications ([facility]). Last visit 3.5 months ago.  Her daughter had her wedding 10/25/2019 - ~1 week ago.  She was very busy before this and could not concentrate on herself.  Reviewed HbA1c levels: 07/30/2019: HbA1c 8.3% Lab Results  Component Value Date   HGBA1C 8.1 (A) 07/17/2019   HGBA1C 7.0 (A) 03/13/2019   HGBA1C 6.8 (A) 11/11/2018  10/2015: HbA1c 8.2%  Pt is on a regimen of: - Metformin ER 500 mg at bedtime >> stopped 2/2 recall 3 weeks >> restarted 500 mg with dinner 03/2019 - Glipizide XL 2.5 mg in am and 2.5 mg (crushed) before dinner >> 2.5 mg twice a day (did not take it as the sugars were controlled...) - Trulicity 1.5 mg >> 3 mg weekly-no side effects She was on Jardiance 10 mg daily in am - started 07/2016 >> could not tolerate it b/c increased urination and hyperglycemia  She was on Metformin 500 mg 1x a day with dinner (500 mg tid >> AP/diarrhea)  >> stopped when started Januvia  Pt checks her sugars once a day: - am: 135, 140, 150-180 >> 160-200, 212 >> 110-178 (ave 150) - 2h after b'fast:  170s >> 135-161, 258 >> n/c - before lunch:  n/c >> 125 >> 150s >> 160-180 >> n/c - 2h after lunch:  150s >> 115 >> n/c >> 142 >> n/c >> 125-158 - before dinner: 128-165 >> n/c >> 196 >> n/c - 2h after dinner: n/c >> 137 >> n/c - bedtime: 103-148 >> 196 >> n/c >> 160-180 >> 125-130 - nighttime: n/c >> 169-208 >> n/c It is unclear at which level she has  hypoglycemia awareness. Highest CBG: 258 (?) - likely meter error >> 220 >> 212 >> 201 x1.  Glucometer: OneTouch  No CKD, last BUN/creatinine and ACR:  07/30/2019: ACR 36.9, BUN/creatinine 19/0.86 07/09/2018: glucose of 173.  BUN/creatinine 13/0.75, GFR 92, ACR 96.3 07/04/2017: 21/0.73, ACR not checked 01/12/2017: Glu 181, 18/0.8, eGFR 86, ACR 42.1 10/22/2015: 21/0.79, ACR 35.6 Lab Results  Component Value Date   BUN 2 (L) 01/04/2010   BUN 4 (L) 01/03/2010   CREATININE 0.82 01/04/2010   CREATININE 0.79 01/03/2010   Lab Results  Component Value Date   MICRALBCREAT 5.6 11/11/2018  On Micardis.  + HL; last set of lipids: 07/30/2019: 208/278/42/118 07/09/2018: 195/250/40/106 07/04/2017: 207/381/40/91 01/12/2017: 199/380/41/82 10/22/2015: 226/201/48/138 No results found for: CHOL, HDL, LDLCALC, LDLDIRECT, TRIG, CHOLHDL  She is not on a statin.  - last eye exam was in 06/2019: No DR, but she had increased intraocular pressure, which worsened.  She is on eyedrops.  She sees Dr. Prudencio Burly. She had cataract sx.  - no numbness and tingling in her feet.   Her daughter had to cancel her June 2020 wedding due to the coronavirus pandemic. She will have it this coming [place].  ROS: Constitutional: no weight gain/+ weight loss, no fatigue, no subjective hyperthermia, no subjective hypothermia Eyes: no blurry vision, no xerophthalmia ENT: no sore throat, no nodules palpated in  neck, no dysphagia, no odynophagia, no hoarseness Cardiovascular: no CP/no SOB/no palpitations/no leg swelling Respiratory: no cough/no SOB/no wheezing Gastrointestinal: no N/no V/no D/no C/no acid reflux Musculoskeletal: no muscle aches/no joint aches Skin: no rashes, no hair loss Neurological: no tremors/no numbness/no tingling/no dizziness  I reviewed pt's medications, allergies, PMH, social hx, family hx, and changes were documented in the history of present illness. Otherwise, unchanged from my initial visit note.  Past  medical history: Patient Active Problem List   Diagnosis Date Noted  . Microalbuminuria 12/11/2016    Priority: High  . Type 2 diabetes mellitus with hyperglycemia ([place]) 12/15/2015    Priority: High  . Dyslipidemia 02/25/2018    Priority: Medium  . 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 Cervical pain 12/15/2015  . Diverticulitis of colon 12/15/2015  . Acid reflux 12/15/2015  . Essential (primary) hypertension 12/15/2015  . Benign breast lumps 12/15/2015  . Knee pain 11/08/2011   Social History   Social History  . Marital status: Married    Spouse name: N/A  . Number of children: 2   Occupational History  . Retired    Social History Main Topics  . Smoking status: Never Smoker  . Smokeless tobacco: Never Used  . Alcohol use Socially   . Drug use: No    Current Outpatient Medications on File Prior to Visit  Medication Sig Dispense Refill  . Blood Glucose Monitoring Suppl (ONETOUCH VERIO) w/Device KIT Use to check blood sugar daily 1 kit 0  . Dulaglutide (TRULICITY) 3 JW/1.1BJ SOPN Inject 3 mg into the skin once a week. 4 pen 11  . glipiZIDE (GLUCOTROL XL) 2.5 MG 24 hr tablet Take 1 tablet (2.5 mg total) by mouth 2 (two) times daily with a meal. 180 tablet 3  . Insulin Pen Needle 32G X 4 MM MISC Use to inject insulin weekly 100 each 5  . latanoprost (XALATAN) 0.005 % ophthalmic solution Place 1 drop into both eyes at bedtime.    . metFORMIN (GLUCOPHAGE-XR) 500 MG 24 hr tablet Take 1 tablet (500 mg total) by mouth daily with supper. 90 tablet 3  . MICARDIS HCT 80-25 MG per tablet Take 1 tablet by mouth daily.    . Multiple Vitamins-Minerals (WOMENS MULTIVITAMIN PO) Take by mouth daily.    Marland Kitchen omeprazole (PRILOSEC) 20 MG capsule Take 20 mg by mouth daily.    [name] Delica Lancets 47W MISC Use to check blood sugar two times a day. 100 each 11  . ONETOUCH VERIO test strip      No current facility-administered medications on file prior to visit.    Allergies   Allergen Reactions  . Sulfa Antibiotics Rash   No family history on file.  PE: BP 128/88   Pulse 87   Ht 5' 2.5" (1.588 m)   Wt 137 lb (62.1 kg)   SpO2 99%   BMI 24.66 kg/m  Wt Readings from Last 3 Encounters:  11/04/19 137 lb (62.1 kg)  07/17/19 141 lb (64 kg)  03/13/19 150 lb (68 kg)   Constitutional: normal weight, in NAD Eyes: PERRLA, EOMI, no exophthalmos ENT: moist mucous membranes, no thyromegaly, no cervical lymphadenopathy Cardiovascular: RRR, No MRG Respiratory: CTA B Gastrointestinal: abdomen soft, NT, ND, BS+ Musculoskeletal: no deformities, strength intact in all 4 Skin: moist, warm, no rashes Neurological: no tremor with outstretched hands, DTR normal in all 4  ASSESSMENT: 1. DM2, non-insulin-dependent, uncontrolled, without long term complications, but with hyperglycemia  2. Microalbuminuria  3. HL  PLAN:  1. Patient with longstanding, uncontrolled, type 2 diabetes, with better control lately on Metformin ER, sulfonylurea (added back at last visit), and weekly GLP-1 receptor agonist. -At last visit, she had multiple stressors in the months prior to the visit and she could not exercise or stay on her diet.  Her HbA1c at that time was 8.1%, increased.  She has another HbA1c obtained by PCP in the same month and this was even higher, at 8.3%. -At this visit, she tells me that she stopped taking the glipizide since her sugars improved.  As of now, per review of her sugars at home, they are still above target in the morning, although much improved.  Before lunch they are better, but occasionally higher than target.  At that time, sugars are at goal. -At this visit we discussed about optimizing how she took the Metformin ER-movement with dinner, but otherwise, I do not feel we need to change her regimen.  She is working on eliminating late dinners and only eating like to dinners.  - I suggested to:  Patient Instructions  Please continue: - Metformin ER 500 mg but  take it with dinner - Trulicity 3 mg weekly  Please return in 4 months with your sugar log.   - we checked her HbA1c: 7.4% (improved) - advised to check sugars at different times of the day - 1x a day, rotating check times - advised for yearly eye exams >> she is UTD - return to clinic in 3-4 months   2. Microalbuminuria -Likely secondary to diabetes -ACR was high more than a year ago and again in 07/2019: 36.9 -Continues on Micardis  3. HL -Reviewed latest lipid panel from 07/2019: Triglycerides high, LDL above goal: 208/278/42/118 -She is not on statin, but we reviewed together her lipid panels since 2018 and I explained that LDL continues to increase.  I strongly suggested that she consider the statin and advised her of the advantages.  I suggested rosuvastatin 5 mg daily or even weekly, but she would like to think about it.   Philemon Kingdom, MD PhD [facility] Endocrinology

## 2019-11-06 DIAGNOSIS — H35351 Cystoid macular degeneration, right eye: Secondary | ICD-10-CM | POA: Diagnosis not present

## 2019-11-06 DIAGNOSIS — H2000 Unspecified acute and subacute iridocyclitis: Secondary | ICD-10-CM | POA: Diagnosis not present

## 2019-11-14 ENCOUNTER — Other Ambulatory Visit: Payer: Self-pay | Admitting: Internal Medicine

## 2019-12-11 DIAGNOSIS — H401112 Primary open-angle glaucoma, right eye, moderate stage: Secondary | ICD-10-CM | POA: Diagnosis not present

## 2019-12-11 DIAGNOSIS — H35351 Cystoid macular degeneration, right eye: Secondary | ICD-10-CM | POA: Diagnosis not present

## 2019-12-16 DIAGNOSIS — M545 Low back pain: Secondary | ICD-10-CM | POA: Diagnosis not present

## 2019-12-16 DIAGNOSIS — N390 Urinary tract infection, site not specified: Secondary | ICD-10-CM | POA: Diagnosis not present

## 2019-12-16 DIAGNOSIS — Z7984 Long term (current) use of oral hypoglycemic drugs: Secondary | ICD-10-CM | POA: Diagnosis not present

## 2019-12-16 DIAGNOSIS — E119 Type 2 diabetes mellitus without complications: Secondary | ICD-10-CM | POA: Diagnosis not present

## 2019-12-22 DIAGNOSIS — H401112 Primary open-angle glaucoma, right eye, moderate stage: Secondary | ICD-10-CM | POA: Diagnosis not present

## 2020-01-05 DIAGNOSIS — H401112 Primary open-angle glaucoma, right eye, moderate stage: Secondary | ICD-10-CM | POA: Diagnosis not present

## 2020-01-07 DIAGNOSIS — H9313 Tinnitus, bilateral: Secondary | ICD-10-CM | POA: Diagnosis not present

## 2020-01-07 DIAGNOSIS — I10 Essential (primary) hypertension: Secondary | ICD-10-CM | POA: Diagnosis not present

## 2020-01-09 DIAGNOSIS — I10 Essential (primary) hypertension: Secondary | ICD-10-CM | POA: Diagnosis not present

## 2020-01-16 DIAGNOSIS — H9313 Tinnitus, bilateral: Secondary | ICD-10-CM | POA: Diagnosis not present

## 2020-01-16 DIAGNOSIS — I10 Essential (primary) hypertension: Secondary | ICD-10-CM | POA: Diagnosis not present

## 2020-01-22 ENCOUNTER — Ambulatory Visit: Payer: Medicare PPO | Admitting: Cardiovascular Disease

## 2020-01-22 ENCOUNTER — Encounter: Payer: Self-pay | Admitting: Cardiovascular Disease

## 2020-01-22 ENCOUNTER — Other Ambulatory Visit: Payer: Self-pay

## 2020-01-22 VITALS — BP 146/84 | HR 76 | Ht 62.0 in | Wt 138.0 lb

## 2020-01-22 DIAGNOSIS — E785 Hyperlipidemia, unspecified: Secondary | ICD-10-CM | POA: Diagnosis not present

## 2020-01-22 DIAGNOSIS — I6523 Occlusion and stenosis of bilateral carotid arteries: Secondary | ICD-10-CM

## 2020-01-22 DIAGNOSIS — I10 Essential (primary) hypertension: Secondary | ICD-10-CM

## 2020-01-22 DIAGNOSIS — E7800 Pure hypercholesterolemia, unspecified: Secondary | ICD-10-CM

## 2020-01-22 DIAGNOSIS — I6529 Occlusion and stenosis of unspecified carotid artery: Secondary | ICD-10-CM | POA: Insufficient documentation

## 2020-01-22 HISTORY — DX: Pure hypercholesterolemia, unspecified: E78.00

## 2020-01-22 MED ORDER — AMLODIPINE BESYLATE 10 MG PO TABS: 10.0000 mg | ORAL_TABLET | Freq: Every day | ORAL | 3 refills | Status: DC

## 2020-01-22 NOTE — Patient Instructions (Addendum)
Medication Instructions:  INCREASE AMLODIPINE TO 10 MG DAILY   STOP METOPROLOL    Labwork: NONE   Testing/Procedures: NONE   Follow-Up: 1 MONTH WITH PHARM D 02/25/2020 AT 9:30 AM    You will receive a phone call from the PREP exercise and nutrition program to schedule an initial assessment.  Special Instructions:   MONITOR AND LOG YOUR BLOOD PRESSURE TWICE A DAY, BRING READINGS AND MACHINE TO YOUR FOLLOW UP   DASH Eating Plan DASH stands for "Dietary Approaches to Stop Hypertension." The DASH eating plan is a healthy eating plan that has been shown to reduce high blood pressure (hypertension). It may also reduce your risk for type 2 diabetes, heart disease, and stroke. The DASH eating plan may also help with weight loss. What are tips for following this plan?  General guidelines  Avoid eating more than 2,300 mg (milligrams) of salt (sodium) a day. If you have hypertension, you may need to reduce your sodium intake to 1,500 mg a day.  Limit alcohol intake to no more than 1 drink a day for nonpregnant women and 2 drinks a day for men. One drink equals 12 oz of beer, 5 oz of wine, or 1 oz of hard liquor.  Work with your health care provider to maintain a healthy body weight or to lose weight. Ask what an ideal weight is for you.  Get at least 30 minutes of exercise that causes your heart to beat faster (aerobic exercise) most days of the week. Activities may include walking, swimming, or biking.  Work with your health care provider or diet and nutrition specialist (dietitian) to adjust your eating plan to your individual calorie needs. Reading food labels   Check food labels for the amount of sodium per serving. Choose foods with less than 5 percent of the Daily Value of sodium. Generally, foods with less than 300 mg of sodium per serving fit into this eating plan.  To find whole grains, look for the word "whole" as the first word in the ingredient list. Shopping  Buy  products labeled as "low-sodium" or "no salt added."  Buy fresh foods. Avoid canned foods and premade or frozen meals. Cooking  Avoid adding salt when cooking. Use salt-free seasonings or herbs instead of table salt or sea salt. Check with your health care provider or pharmacist before using salt substitutes.  Do not fry foods. Cook foods using healthy methods such as baking, boiling, grilling, and broiling instead.  Cook with heart-healthy oils, such as olive, canola, soybean, or sunflower oil. Meal planning  Eat a balanced diet that includes: ? 5 or more servings of fruits and vegetables each day. At each meal, try to fill half of your plate with fruits and vegetables. ? Up to 6-8 servings of whole grains each day. ? Less than 6 oz of lean meat, poultry, or fish each day. A 3-oz serving of meat is about the same size as a deck of cards. One egg equals 1 oz. ? 2 servings of low-fat dairy each day. ? A serving of nuts, seeds, or beans 5 times each week. ? Heart-healthy fats. Healthy fats called Omega-3 fatty acids are found in foods such as flaxseeds and coldwater fish, like sardines, salmon, and mackerel.  Limit how much you eat of the following: ? Canned or prepackaged foods. ? Food that is high in trans fat, such as fried foods. ? Food that is high in saturated fat, such as fatty meat. ? Sweets, desserts, sugary  drinks, and other foods with added sugar. ? Full-fat dairy products.  Do not salt foods before eating.  Try to eat at least 2 vegetarian meals each week.  Eat more home-cooked food and less restaurant, buffet, and fast food.  When eating at a restaurant, ask that your food be prepared with less salt or no salt, if possible. What foods are recommended? The items listed may not be a complete list. Talk with your dietitian about what dietary choices are best for you. Grains Whole-grain or whole-wheat bread. Whole-grain or whole-wheat pasta. Brown rice. Orpah Cobb.  Bulgur. Whole-grain and low-sodium cereals. Pita bread. Low-fat, low-sodium crackers. Whole-wheat flour tortillas. Vegetables Fresh or frozen vegetables (raw, steamed, roasted, or grilled). Low-sodium or reduced-sodium tomato and vegetable juice. Low-sodium or reduced-sodium tomato sauce and tomato paste. Low-sodium or reduced-sodium canned vegetables. Fruits All fresh, dried, or frozen fruit. Canned fruit in natural juice (without added sugar). Meat and other protein foods Skinless chicken or [place]. Ground chicken or [place]. Pork with fat trimmed off. Fish and seafood. Egg whites. Dried beans, peas, or lentils. Unsalted nuts, nut butters, and seeds. Unsalted canned beans. Lean cuts of beef with fat trimmed off. Low-sodium, lean deli meat. Dairy Low-fat (1%) or fat-free (skim) milk. Fat-free, low-fat, or reduced-fat cheeses. Nonfat, low-sodium ricotta or cottage cheese. Low-fat or nonfat yogurt. Low-fat, low-sodium cheese. Fats and oils Soft margarine without trans fats. Vegetable oil. Low-fat, reduced-fat, or light mayonnaise and salad dressings (reduced-sodium). Canola, safflower, olive, soybean, and sunflower oils. Avocado. Seasoning and other foods Herbs. Spices. Seasoning mixes without salt. Unsalted popcorn and pretzels. Fat-free sweets. What foods are not recommended? The items listed may not be a complete list. Talk with your dietitian about what dietary choices are best for you. Grains Baked goods made with fat, such as croissants, muffins, or some breads. Dry pasta or rice meal packs. Vegetables Creamed or fried vegetables. Vegetables in a cheese sauce. Regular canned vegetables (not low-sodium or reduced-sodium). Regular canned tomato sauce and paste (not low-sodium or reduced-sodium). Regular tomato and vegetable juice (not low-sodium or reduced-sodium). Rosita Fire. Olives. Fruits Canned fruit in a light or heavy syrup. Fried fruit. Fruit in cream or butter sauce. Meat and other  protein foods Fatty cuts of meat. Ribs. Fried meat. Tomasa Blase. Sausage. Bologna and other processed lunch meats. Salami. Fatback. Hotdogs. Bratwurst. Salted nuts and seeds. Canned beans with added salt. Canned or smoked fish. Whole eggs or egg yolks. Chicken or [place] with skin. Dairy Whole or 2% milk, cream, and half-and-half. Whole or full-fat cream cheese. Whole-fat or sweetened yogurt. Full-fat cheese. Nondairy creamers. Whipped toppings. Processed cheese and cheese spreads. Fats and oils Butter. Stick margarine. Lard. Shortening. Ghee. Bacon fat. Tropical oils, such as coconut, palm kernel, or palm oil. Seasoning and other foods Salted popcorn and pretzels. Onion salt, garlic salt, seasoned salt, table salt, and sea salt. Worcestershire sauce. Tartar sauce. Barbecue sauce. Teriyaki sauce. Soy sauce, including reduced-sodium. Steak sauce. Canned and packaged gravies. Fish sauce. Oyster sauce. Cocktail sauce. Horseradish that you find on the shelf. Ketchup. Mustard. Meat flavorings and tenderizers. Bouillon cubes. Hot sauce and Tabasco sauce. Premade or packaged marinades. Premade or packaged taco seasonings. Relishes. Regular salad dressings. Where to find more information:  National Heart, Lung, and Blood Institute: PopSteam.is  American Heart Association: www.heart.org Summary  The DASH eating plan is a healthy eating plan that has been shown to reduce high blood pressure (hypertension). It may also reduce your risk for type 2 diabetes, heart disease, and  stroke.  With the DASH eating plan, you should limit salt (sodium) intake to 2,300 mg a day. If you have hypertension, you may need to reduce your sodium intake to 1,500 mg a day.  When on the DASH eating plan, aim to eat more fresh fruits and vegetables, whole grains, lean proteins, low-fat dairy, and heart-healthy fats.  Work with your health care provider or diet and nutrition specialist (dietitian) to adjust your eating plan to your  individual calorie needs. This information is not intended to replace advice given to you by your health care provider. Make sure you discuss any questions you have with your health care provider. Document Released: 04/13/2011 Document Revised: 04/06/2017 Document Reviewed: 04/17/2016 Elsevier Patient Education  2020 [employer].

## 2020-01-22 NOTE — Progress Notes (Signed)
Hypertension Clinic Initial Assessment:    Date:  01/22/2020   ID:  Sandra [name], DOB [birth year], MRN [id number]  PCP:  Josetta Huddle, MD  Cardiologist:  No primary care provider on file.  Nephrologist:  Referring MD: Josetta Huddle, MD   CC: Hypertension  History of Present Illness:    Sandra [name] is a 74 y.o. female with a hx of diabetes, hypertension, mild carotid stenosis, and hyperlipidemia here to establish care in the hypertension clinic. She has been on her same BP medication for 25 years.  Her blood pressure was pretty stable.  However recently she saw Dr. Inda Merlin because she started experiencing tinnitus.  When he saw her her blood pressure was in the 150s over 90s.  She had a screening a couple weeks prior and her blood pressure was elevated then as well.  She was started on amlodipine 5 mg.  Her blood pressure remained elevated so a few days ago metoprolol 25 mg was added.  When she checks her blood pressure now it typically is in the 140s over 80s to 90s.  She had one episode of lightheadedness when she first stood but otherwise has been tolerating these medications well.  She has not been as active lately.  Previously she likes to walk regularly.  In the last year she has stopped doing this.  She has no exertional chest pain or shortness of breath.  She denies any lower extremity edema, orthopnea, or PND.  She notes that her appetite has been diminished and she started taking Trulicity.  She does try to limit her salt intake and mostly seasons with pepper.  Lately she has been ordering out more than cooking at home.  She does not drink coffee but does have 1 Coke daily.  She rarely uses Advil.  She does not use any over-the-counter supplements.  She does not have apnea but does snore.  She feels rested when she awakens in the morning.   Past Medical History:  Diagnosis Date  . Pure hypercholesterolemia 01/22/2020    No past surgical history on file.  Current  Medications: Current Meds  Medication Sig  . amLODipine (NORVASC) 10 MG tablet Take 1 tablet (10 mg total) by mouth daily.  . Blood Glucose Monitoring Suppl (ONETOUCH VERIO) w/Device KIT Use to check blood sugar daily  . Insulin Pen Needle 32G X 4 MM MISC Use to inject insulin weekly  . latanoprost (XALATAN) 0.005 % ophthalmic solution Place 1 drop into both eyes at bedtime.  . metFORMIN (GLUCOPHAGE-XR) 500 MG 24 hr tablet Take 1 tablet (500 mg total) by mouth daily with supper.  Marland Kitchen MICARDIS HCT 80-25 MG per tablet Take 1 tablet by mouth daily.  . Multiple Vitamins-Minerals (WOMENS MULTIVITAMIN PO) Take by mouth daily.  Marland Kitchen omeprazole (PRILOSEC) 20 MG capsule Take 20 mg by mouth daily.  [name] Delica Lancets 43X MISC Use to check blood sugar two times a day.  [name] VERIO test strip   . TRULICITY 3 VQ/0.0QQ SOPN INJECT 3 MG INTO THE SKIN ONCE A WEEK.  . [DISCONTINUED] amLODipine (NORVASC) 5 MG tablet   . [DISCONTINUED] glipiZIDE (GLUCOTROL XL) 2.5 MG 24 hr tablet Take 1 tablet (2.5 mg total) by mouth 2 (two) times daily with a meal.  . [DISCONTINUED] metoprolol succinate (TOPROL-XL) 25 MG 24 hr tablet      Allergies:   Sulfa antibiotics   Social History   Socioeconomic History  . Marital status: Married    Spouse  name: Not on file  . Number of children: Not on file  . Years of education: Not on file  . Highest education level: Not on file  Occupational History  . Not on file  Tobacco Use  . Smoking status: Never Smoker  . Smokeless tobacco: Never Used  Substance and Sexual Activity  . Alcohol use: Not on file  . Drug use: Not on file  . Sexual activity: Not on file  Other Topics Concern  . Not on file  Social History Narrative  . Not on file   Social Determinants of Health   Financial Resource Strain:   . Difficulty of Paying Living Expenses: Not on file  Food Insecurity:   . Worried About Charity fundraiser in the Last Year: Not on file  . Ran Out of Food in  the Last Year: Not on file  Transportation Needs:   . Lack of Transportation (Medical): Not on file  . Lack of Transportation (Non-Medical): Not on file  Physical Activity:   . Days of Exercise per Week: Not on file  . Minutes of Exercise per Session: Not on file  Stress:   . Feeling of Stress : Not on file  Social Connections:   . Frequency of Communication with Friends and Family: Not on file  . Frequency of Social Gatherings with Friends and Family: Not on file  . Attends Religious Services: Not on file  . Active Member of Clubs or Organizations: Not on file  . Attends Archivist Meetings: Not on file  . Marital Status: Not on file     Family History: The patient's family history includes CAD in her brother; Diabetes in her brother; Heart attack in her brother; Heart disease in her sister; Lung cancer in her sister; Peripheral Artery Disease in her sister; Stroke in her brother and brother.  ROS:   Please see the history of present illness.     All other systems reviewed and are negative.  EKGs/Labs/Other Studies Reviewed:    EKG:  EKG is ordered today.  The ekg ordered today demonstrates sinus rhythm.  Rate 76 bpm.    Recent Labs: No results found for requested labs within last 8760 hours.   Recent Lipid Panel No results found for: CHOL, TRIG, HDL, CHOLHDL, VLDL, LDLCALC, LDLDIRECT  Physical Exam:    VS:  BP (!) 146/84   Pulse 76   Ht 5' 2" (1.575 m)   Wt 138 lb (62.6 kg)   SpO2 99%   BMI 25.24 kg/m  , BMI Body mass index is 25.24 kg/m. GENERAL:  Well appearing HEENT: Pupils equal round and reactive, fundi not visualized, oral mucosa unremarkable NECK:  No jugular venous distention, waveform within normal limits, carotid upstroke brisk and symmetric, no bruits LUNGS:  Clear to auscultation bilaterally HEART:  RRR.  PMI not displaced or sustained,S1 and S2 within normal limits, no S3, no S4, no clicks, no rubs, no murmurs ABD:  Flat, positive bowel  sounds normal in frequency in pitch, no bruits, no rebound, no guarding, no midline pulsatile mass, no hepatomegaly, no splenomegaly EXT:  2 plus pulses throughout, no edema, no cyanosis no clubbing SKIN:  No rashes no nodules NEURO:  Cranial nerves II through XII grossly intact, motor grossly intact throughout PSYCH:  Cognitively intact, oriented to person place and time  ASSESSMENT:    1. Essential hypertension   2. Essential (primary) hypertension   3. Dyslipidemia   4. Bilateral carotid artery stenosis  5. Pure hypercholesterolemia     PLAN:    # Essential hypertension:  BP was stable on telmisartan/HCTZ for years.  Lately it has been creeping up.  I suspect this is at least partly because she has been less active.  We did discuss the importance of getting back into her walking routine.  In the meantime, we will increase amlodipine to 10 mg.  She was instructed to watch for edema.  We will stop the metoprolol.  She will also work on limiting her takeout food and thus, reducing her sodium intake.  She will also stop taking Advil.She is interested in participating in the PREP program through the [facility].  She will track her blood pressure twice daily and bring for follow-up.   # Carotid stenosis:  # Hyperlipidemia:  Agree with the recent addition of atorvastatin.  Discuss repeating at follow up.   Disposition:    FU with MD/PharmD in 1 month     Medication Adjustments/Labs and Tests Ordered: Current medicines are reviewed at length with the patient today.  Concerns regarding medicines are outlined above.  Orders Placed This Encounter  Procedures  . EKG 12-Lead   Meds ordered this encounter  Medications  . amLODipine (NORVASC) 10 MG tablet    Sig: Take 1 tablet (10 mg total) by mouth daily.    Dispense:  90 tablet    Refill:  3    NEW DOSE, D/C 5 MG RX     Signed, Skeet Latch, MD  01/22/2020 4:47 PM    [place]

## 2020-01-26 ENCOUNTER — Telehealth: Payer: Self-pay

## 2020-01-26 NOTE — Telephone Encounter (Signed)
[facility] pt reference PREP referral Request call back to discuss

## 2020-01-29 DIAGNOSIS — H9313 Tinnitus, bilateral: Secondary | ICD-10-CM | POA: Diagnosis not present

## 2020-01-29 DIAGNOSIS — I10 Essential (primary) hypertension: Secondary | ICD-10-CM | POA: Diagnosis not present

## 2020-02-18 DIAGNOSIS — H9313 Tinnitus, bilateral: Secondary | ICD-10-CM | POA: Diagnosis not present

## 2020-02-18 DIAGNOSIS — H903 Sensorineural hearing loss, bilateral: Secondary | ICD-10-CM | POA: Diagnosis not present

## 2020-02-19 ENCOUNTER — Other Ambulatory Visit: Payer: Self-pay | Admitting: Internal Medicine

## 2020-02-25 ENCOUNTER — Ambulatory Visit (INDEPENDENT_AMBULATORY_CARE_PROVIDER_SITE_OTHER): Payer: Medicare PPO | Admitting: Pharmacist

## 2020-02-25 ENCOUNTER — Other Ambulatory Visit: Payer: Self-pay

## 2020-02-25 VITALS — BP 160/98 | HR 83 | Resp 16 | Ht 62.0 in | Wt 136.2 lb

## 2020-02-25 DIAGNOSIS — I10 Essential (primary) hypertension: Secondary | ICD-10-CM | POA: Diagnosis not present

## 2020-02-25 NOTE — Patient Instructions (Addendum)
Return for a  follow up appointment in 4 weeks  Check your blood pressure at home daily (if able) and keep record of the readings.   CALL PAMELA HINSHAW to arrange PREP exercise program : [phone]  Take your BP meds as follows: *NO CHANGE MEDICATION* *INCREASE amount of water per day* *Work on decreasing dietary sodium - HOLD on ordering take out when possible*  Bring all of your meds, your BP cuff and your record of home blood pressures to your next appointment.  Exercise as you're able, try to walk approximately 30 minutes per day.  Keep salt intake to a minimum, especially watch canned and prepared boxed foods.  Eat more fresh fruits and vegetables and fewer canned items.  Avoid eating in fast food restaurants.    HOW TO TAKE YOUR BLOOD PRESSURE: . Rest 5 minutes before taking your blood pressure. .  Don't smoke or drink caffeinated beverages for at least 30 minutes before. . Take your blood pressure before (not after) you eat. . Sit comfortably with your back supported and both feet on the floor (don't cross your legs). . Elevate your arm to heart level on a table or a desk. . Use the proper sized cuff. It should fit smoothly and snugly around your bare upper arm. There should be enough room to slip a fingertip under the cuff. The bottom edge of the cuff should be 1 inch above the crease of the elbow. . Ideally, take 3 measurements at one sitting and record the average.

## 2020-02-25 NOTE — Progress Notes (Signed)
Patient ID: Sandra [name]                 DOB: 13-Dec-1945                      MRN: [id number]     HPI:  Sandra [name] is a 74 y.o. female referred by Dr. Oval Linsey to [facility] clinic. PMH includes diabetes, hypertension, mild carotid stenosis, and hyperlipidemia. BP was controlled with same medication for over 20 years, until recently. She stopped her daily walks as well, but denies dizziness, swelling, chest pain, or shortness of breath. Noted she was NOT taking metoprolol during last OV with Dr Oval Linsey, but taking since last OV with PCP where he insisted, she should be taking it as prescribed.  Needs return call to ([name]) PREP program back to schedule exercise program  Current HTN meds:  Amlodipine 87m daily Telmisartan/HCTZ 80/263mdaily Metoprolol Succinate 2511maily   Intolerance: none  BP goal: <130/80  Family History: The patient's family history includes CAD in her brother; Diabetes in her brother; Heart attack in her brother; Heart disease in her sister; Lung cancer in her sister; Peripheral Artery Disease in her sister; Stroke in her brother and brother.  Social History: Denies tobacco, and alcohol abuse  Diet: eating more at home. Reduce appetite since Trulicity (down [id number]), stopped getting chick-fil-a sandwiches, Chinese/[place] teriyaki chicken and vegetables.  Exercise: need to return call to schedule PREP section  Home BP readings:  8 home reading provided; average 140/82; HR range 76-81bpm  Wt Readings from Last 3 Encounters:  02/25/20 136 lb 3.2 oz (61.8 kg)  01/22/20 138 lb (62.6 kg)  11/04/19 137 lb (62.1 kg)   BP Readings from Last 3 Encounters:  02/25/20 (!) 160/98  01/22/20 (!) 146/84  11/04/19 128/88   Pulse Readings from Last 3 Encounters:  02/25/20 83  01/22/20 76  11/04/19 87     Past Medical History:  Diagnosis Date  . Pure hypercholesterolemia 01/22/2020    Current Outpatient Medications on File Prior to Visit  Medication Sig  Dispense Refill  . amLODipine (NORVASC) 10 MG tablet Take 1 tablet (10 mg total) by mouth daily. 90 tablet 3  . Blood Glucose Monitoring Suppl (ONETOUCH VERIO) w/Device KIT Use to check blood sugar daily 1 kit 0  . Insulin Pen Needle 32G X 4 MM MISC Use to inject insulin weekly 100 each 5  . latanoprost (XALATAN) 0.005 % ophthalmic solution Place 1 drop into both eyes at bedtime.    . metFORMIN (GLUCOPHAGE-XR) 500 MG 24 hr tablet Take 1 tablet (500 mg total) by mouth daily with supper. 90 tablet 3  . metoprolol succinate (TOPROL-XL) 25 MG 24 hr tablet Take 25 mg by mouth daily.    . MMarland KitchenCARDIS HCT 80-25 MG per tablet Take 1 tablet by mouth daily.    . Multiple Vitamins-Minerals (WOMENS MULTIVITAMIN PO) Take by mouth daily.    . oMarland Kitcheneprazole (PRILOSEC) 20 MG capsule Take 20 mg by mouth daily.    . OGlory Rosebushlica Lancets [id number] Use to check blood sugar two times a day. 100 each 11  . ONETOUCH VERIO test strip     . TRULICITY 3 MG/JQ/3.0SPPN INJECT 3 MG INTO THE SKIN ONCE A WEEK. 0.5 mL 1   No current facility-administered medications on file prior to visit.    Allergies  Allergen Reactions  . Sulfa Antibiotics Rash    Blood pressure (!) 160/98, pulse 83,  resp. rate 16, height '5\' 2"'  (1.575 m), weight 136 lb 3.2 oz (61.8 kg), SpO2 99 %.  Essential (primary) hypertension BP remains above goal of 130/80, and patient resume metoprolol therapy less than 2 weeks ago. She admits to dietary indiscretions, and lack physical activity. [name]'s phone number was provided to initiate PREP program. HTN booklet provided, salty 6 discussed, need to decrease take-out food especially teriyaki chicken and [place] food emphasized. She will increase amount of daily water as well.  Will continue all medication without changes for now. May stop metoprolol and add spironolactone 69m to therapy during next office visit or replace metoprolol for carvedilol twice daily. No other indication for betablocker therapy noted  after chart review.    Ashanti Littles Rodriguez-Guzman PharmD, BCPS, [id number]/26/2021 4:07 PM

## 2020-02-27 ENCOUNTER — Telehealth: Payer: Self-pay

## 2020-02-27 NOTE — Telephone Encounter (Signed)
[facility] requesting call back to discuss referral to PREP 

## 2020-03-02 ENCOUNTER — Encounter: Payer: Self-pay | Admitting: Pharmacist

## 2020-03-02 NOTE — Assessment & Plan Note (Signed)
BP remains above goal of 130/80, and patient resume metoprolol therapy less than 2 weeks ago. She admits to dietary indiscretions, and lack physical activity. [name]'s phone number was provided to initiate PREP program. HTN booklet provided, salty 6 discussed, need to decrease take-out food especially teriyaki chicken and [place] food emphasized. She will increase amount of daily water as well.  Will continue all medication without changes for now. May stop metoprolol and add spironolactone 25mg  to therapy during next office visit or replace metoprolol for carvedilol twice daily. No other indication for betablocker therapy noted after chart review.

## 2020-03-09 ENCOUNTER — Encounter: Payer: Self-pay | Admitting: Internal Medicine

## 2020-03-09 ENCOUNTER — Other Ambulatory Visit: Payer: Self-pay

## 2020-03-09 ENCOUNTER — Ambulatory Visit: Payer: Medicare PPO | Admitting: Internal Medicine

## 2020-03-09 VITALS — BP 132/72 | HR 86 | Ht 62.0 in | Wt 135.0 lb

## 2020-03-09 DIAGNOSIS — E1165 Type 2 diabetes mellitus with hyperglycemia: Secondary | ICD-10-CM

## 2020-03-09 DIAGNOSIS — E785 Hyperlipidemia, unspecified: Secondary | ICD-10-CM

## 2020-03-09 DIAGNOSIS — R809 Proteinuria, unspecified: Secondary | ICD-10-CM | POA: Diagnosis not present

## 2020-03-09 LAB — POCT GLYCOSYLATED HEMOGLOBIN (HGB A1C): Hemoglobin A1C: 8.5 % — AB (ref 4.0–5.6)

## 2020-03-09 MED ORDER — GLIPIZIDE ER 2.5 MG PO TB24: 5.0000 mg | ORAL_TABLET | Freq: Every day | ORAL | 3 refills | Status: DC

## 2020-03-09 MED ORDER — METFORMIN HCL ER 500 MG PO TB24: 1000.0000 mg | ORAL_TABLET | Freq: Every day | ORAL | 3 refills | Status: DC

## 2020-03-09 MED ORDER — TRULICITY 3 MG/0.5ML ~~LOC~~ SOAJ: 3.0000 mg | SUBCUTANEOUS | 3 refills | Status: DC

## 2020-03-09 NOTE — Addendum Note (Signed)
Addended by: Darene Lamer T on: 03/09/2020 10:18 AM   Modules accepted: Orders

## 2020-03-09 NOTE — Patient Instructions (Addendum)
Please continue: - Metformin 500 mg with dinner (try to add another tablet if tolerated) - Trulicity 3 mg weekly  Please add: - Glipizide XL 5 mg before b'fast  Please return in 3 months with your sugar log.

## 2020-03-09 NOTE — Progress Notes (Signed)
Patient ID: Sandra [name], female   DOB: March 17, 1946, 74 y.o.   MRN: [id number]   This visit occurred during the SARS-CoV-2 public health emergency.  Safety protocols were in place, including screening questions prior to the visit, additional usage of staff PPE, and extensive cleaning of exam room while observing appropriate contact time as indicated for disinfecting solutions.   HPI: Sandra [name] is a 74 y.o.-year-old female, returning for f/u for DM2, dx in 2015, non-insulin-dependent, uncontrolled, with long term complications ([facility]). Last visit 4 months ago.  Reviewed HbA1c levels: Lab Results  Component Value Date   HGBA1C 7.4 (A) 11/04/2019   HGBA1C 8.1 (A) 07/17/2019   HGBA1C 7.0 (A) 03/13/2019  07/30/2019: HbA1c 8.3% 10/2015: HbA1c 8.2%  Pt is on a regimen of: - Metformin ER 500 mg at bedtime >> stopped 2/2 recall 3 weeks >> restarted 500 mg with dinner -  (did not take it as the sugars were controlled...) - Trulicity 1.5 mg >> 3 mg weekly-no GI side effects She was on Jardiance 10 mg daily in am - started 07/2016 >> could not tolerate it b/c increased urination and hyperglycemia  She was on Metformin 500 mg 1x a day with dinner (500 mg tid >> AP/diarrhea)  >> stopped when started Januvia  Pt checks her sugars once a day: - am: 135, 140, 150-180 >> 160-200, 212 >> 110-178 (ave 150) >> 148-220 - 2h after b'fast:  170s >> 135-161, 258 >> n/c - before lunch:  n/c >> 125 >> 150s >> 160-180 >> n/c - 2h after lunch:  150s >> 115 >> n/c >> 142 >> n/c >> 125-158 >> 200 - before dinner: 128-165 >> n/c >> 196 >> n/c - 2h after dinner: n/c >> 137 >> n/c - bedtime: 103-148 >> 196 >> n/c >> 160-180 >> 125-130 >> 163, 164 - nighttime: n/c >> 169-208 >> n/c It is unclear at which CBG level she has hypoglycemia awareness. Highest CBG: 220 >> 212 >> 201 x1 > 220.  Glucometer: OneTouch  No CKD, last BUN/creatinine and ACR:  07/30/2019: ACR 36.9, BUN/creatinine 19/0.86 07/09/2018:  glucose of 173.  BUN/creatinine 13/0.75, GFR 92, ACR 96.3 07/04/2017: 21/0.73, ACR not checked 01/12/2017: Glu 181, 18/0.8, eGFR 86, ACR 42.1 10/22/2015: 21/0.79, ACR 35.6 Lab Results  Component Value Date   BUN 2 (L) 01/04/2010   BUN 4 (L) 01/03/2010   CREATININE 0.82 01/04/2010   CREATININE 0.79 01/03/2010   Lab Results  Component Value Date   MICRALBCREAT 5.6 11/11/2018  On Micardis.  + HL; last set of lipids: 07/30/2019: 208/278/42/118 07/09/2018: 195/250/40/106 07/04/2017: 207/381/40/91 01/12/2017: 199/380/41/82 10/22/2015: 226/201/48/138 No results found for: CHOL, HDL, LDLCALC, LDLDIRECT, TRIG, CHOLHDL  She started rosuvastatin 5 mg weekly by PCP approximately a month ago.  - last eye exam was in 06/2019: No DR but worsened intraocular pressure.  She is on eyedrops.  She sees Dr. Prudencio Burly. She had cataract sx.  - no numbness and tingling in her feet.   She had worsening hypertension since last visit and added Amlodipine.  ROS: Constitutional: no weight gain/no weight loss, no fatigue, no subjective hyperthermia, no subjective hypothermia Eyes: no blurry vision, no xerophthalmia ENT: no sore throat, no nodules palpated in neck, no dysphagia, no odynophagia, no hoarseness, + tinnitus Cardiovascular: no CP/no SOB/no palpitations/no leg swelling Respiratory: no cough/no SOB/no wheezing Gastrointestinal: no N/no V/no D/no C/no acid reflux Musculoskeletal: no muscle aches/no joint aches Skin: no rashes, no hair loss Neurological: no tremors/no numbness/no tingling/no dizziness  I reviewed pt's medications, allergies, PMH, social hx, family hx, and changes were documented in the history of present illness. Otherwise, unchanged from my initial visit note.  Past medical history: Patient Active Problem List   Diagnosis Date Noted  . Microalbuminuria 12/11/2016    Priority: High  . Type 2 diabetes mellitus with hyperglycemia (Sandra [name]) 12/15/2015    Priority: High  . Dyslipidemia  02/25/2018    Priority: Medium  . Carotid stenosis 01/22/2020  . Pure hypercholesterolemia 01/22/2020  . 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 Cervical pain 12/15/2015  . Diverticulitis of colon 12/15/2015  . Acid reflux 12/15/2015  . Essential (primary) hypertension 12/15/2015  . Benign breast lumps 12/15/2015  . Knee pain 11/08/2011   Social History   Social History  . Marital status: Married    Spouse name: N/A  . Number of children: 2   Occupational History  . Retired    Social History Main Topics  . Smoking status: Never Smoker  . Smokeless tobacco: Never Used  . Alcohol use Socially   . Drug use: No    Current Outpatient Medications on File Prior to Visit  Medication Sig Dispense Refill  . amLODipine (NORVASC) 10 MG tablet Take 1 tablet (10 mg total) by mouth daily. 90 tablet 3  . Blood Glucose Monitoring Suppl (ONETOUCH VERIO) w/Device KIT Use to check blood sugar daily 1 kit 0  . Insulin Pen Needle 32G X 4 MM MISC Use to inject insulin weekly 100 each 5  . latanoprost (XALATAN) 0.005 % ophthalmic solution Place 1 drop into both eyes at bedtime.    . metFORMIN (GLUCOPHAGE-XR) 500 MG 24 hr tablet Take 1 tablet (500 mg total) by mouth daily with supper. 90 tablet 3  . metoprolol succinate (TOPROL-XL) 25 MG 24 hr tablet Take 25 mg by mouth daily.    Marland Kitchen MICARDIS HCT 80-25 MG per tablet Take 1 tablet by mouth daily.    . Multiple Vitamins-Minerals (WOMENS MULTIVITAMIN PO) Take by mouth daily.    Marland Kitchen omeprazole (PRILOSEC) 20 MG capsule Take 20 mg by mouth daily.    Sandra [name] Delica Lancets 91P MISC Use to check blood sugar two times a day. 100 each 11  . ONETOUCH VERIO test strip     . TRULICITY 3 HX/5.0VW SOPN INJECT 3 MG INTO THE SKIN ONCE A WEEK. 0.5 mL 1   No current facility-administered medications on file prior to visit.    Allergies  Allergen Reactions  . Sulfa Antibiotics Rash   Family History  Problem Relation Age of Onset  . Peripheral Artery  Disease Sister   . Stroke Brother   . Diabetes Brother   . Heart attack Brother   . Lung cancer Sister   . Stroke Brother   . CAD Brother   . Heart disease Sister     PE: BP 132/72   Pulse 86   Ht '5\' 2"'  (1.575 m)   Wt 135 lb (61.2 kg)   SpO2 99%   BMI 24.69 kg/m  Wt Readings from Last 3 Encounters:  03/09/20 135 lb (61.2 kg)  02/25/20 136 lb 3.2 oz (61.8 kg)  01/22/20 138 lb (62.6 kg)   Constitutional: normal weight, in NAD Eyes: PERRLA, EOMI, no exophthalmos ENT: moist mucous membranes, no thyromegaly, no cervical lymphadenopathy Cardiovascular: RRR, No MRG Respiratory: CTA B Gastrointestinal: abdomen soft, NT, ND, BS+ Musculoskeletal: no deformities, strength intact in all 4 Skin: moist, warm, no rashes Neurological: no tremor with outstretched hands, DTR normal  in all 4  ASSESSMENT: 1. DM2, non-insulin-dependent, uncontrolled, without long term complications, but with hyperglycemia  2. Microalbuminuria  3. HL  PLAN:  1. Patient with longstanding, uncontrolled, type 2 diabetes, with improved control on Metformin ER, and Trulicity.  We had her on sulfonylurea in the past but she stopped this as her sugars improved.  At last visit, sugars were at or close to goal and she was working on eliminating late dinners and only eating late dinners.  We moved her Metformin ER with dinner but otherwise did not change her regimen.  Of note, she tolerates Trulicity well, without GI side effects. -At today's visit, she did not check sugars frequently, but whenever she checked, they were mostly above goal, especially in the morning.  Highest blood sugars are in the 200s.  She definitely will need to work on her diet, but for now, I advised her to try to increase the Metformin ER (she does not know what she can do this due to GI symptoms) and will also add glipizide Xl back at 5 mg before breakfast. - I suggested to:  Patient Instructions  Please continue: - Metformin 500 mg with dinner  (try to add another tablet if tolerated) - Trulicity 3 mg weekly  Please add: - Glipizide XL 5 mg before b'fast  Please return in 3 months with your sugar log.   - we checked her HbA1c: 8.5% (higher) - advised to check sugars at different times of the day - 1x a day, rotating check times - advised for yearly eye exams >> she is UTD - return to clinic in 3 months   2. Microalbuminuria -Likely secondary to diabetes -ACR was high more than a year ago and again in 07/2019: 36.9 -Continues on Micardis  3. HL -Reviewed latest lipid panel from 07/2019: Triglycerides high, LDL above goal: 208/278/42/118 -At last visit I suggested rosuvastatin 5 mg weekly as she was reticent to take it daily.  She wanted to think about it.   - at this visit, she tells me she did actually start this - prescribed by PCP - 1 mo ago   Philemon Kingdom, MD PhD [facility]

## 2020-03-11 DIAGNOSIS — H401112 Primary open-angle glaucoma, right eye, moderate stage: Secondary | ICD-10-CM | POA: Diagnosis not present

## 2020-03-12 ENCOUNTER — Telehealth: Payer: Self-pay

## 2020-03-12 NOTE — Telephone Encounter (Signed)
Call placed to pt to confirm PREP start of 11/15 (MW 1pm-215pm) x 12 wks at [facility]  Intake scheduled for 11/8 at 4pm

## 2020-03-15 NOTE — Progress Notes (Signed)
[facility] Progress Report   Patient Details  Name: Sandra [name] MRN: [id number] Date of Birth: [birth year] Age: 74 y.o. PCP: Marden Noble, MD  Vitals:   03/15/20 1700  BP: 132/83  Pulse: 79  SpO2: 95%  Weight: 136 lb 3.2 oz (61.8 kg)  Height: 5\' 2"  (1.575 m)      Past Medical History:  Diagnosis Date  . Pure hypercholesterolemia 01/22/2020   No past surgical history on file. Social History   Tobacco Use  Smoking Status Never Smoker  Smokeless Tobacco Never Used      01/24/2020 03/15/2020, 5:04 PM

## 2020-03-24 ENCOUNTER — Ambulatory Visit (INDEPENDENT_AMBULATORY_CARE_PROVIDER_SITE_OTHER): Payer: Medicare PPO | Admitting: Pharmacist

## 2020-03-24 VITALS — BP 150/82 | HR 82 | Resp 16 | Ht 62.0 in | Wt 135.2 lb

## 2020-03-24 DIAGNOSIS — I10 Essential (primary) hypertension: Secondary | ICD-10-CM | POA: Diagnosis not present

## 2020-03-24 LAB — BASIC METABOLIC PANEL
BUN/Creatinine Ratio: 20 (ref 12–28)
BUN: 21 mg/dL (ref 8–27)
CO2: 25 mmol/L (ref 20–29)
Calcium: 10.4 mg/dL — ABNORMAL HIGH (ref 8.7–10.3)
Chloride: 98 mmol/L (ref 96–106)
Creatinine, Ser: 1.03 mg/dL — ABNORMAL HIGH (ref 0.57–1.00)
GFR calc Af Amer: 63 mL/min/{1.73_m2} (ref >59)
GFR calc non Af Amer: 54 mL/min/{1.73_m2} — ABNORMAL LOW (ref >59)
Glucose: 178 mg/dL — ABNORMAL HIGH (ref 65–99)
Potassium: 4.6 mmol/L (ref 3.5–5.2)
Sodium: 137 mmol/L (ref 134–144)

## 2020-03-24 MED ORDER — CARVEDILOL 6.25 MG PO TABS: 6.2500 mg | ORAL_TABLET | Freq: Two times a day (BID) | ORAL | 1 refills | Status: DC

## 2020-03-24 NOTE — Progress Notes (Signed)
Patient ID: Sandra [name]                 DOB: [birth year]                      MRN: [id number]     HPI: GW is a 74 yo female with mild carotid stenosis and other PMH below who presents today for hypertension clinic evaluation. Her BP was controlled with the same medications for over 20 years, but recently her BP has been elevated. When she saw Dr. Duke Salvia in September, amlodipine was increased to 10 mg qd and she was no longer taking metoprolol.  However, her PCP put her back on metoprolol. Her BP was still uncontrolled when she was seen 02/25/20, but she was not following a healthy diet and lacked physical activity. She was instructed to keep all her medications the same but focus on making the lifestyle changes that were discussed. She just started PREP this week, has been working on improving her diet, and has not had any problems with her medications.   PMH: T2DM 03/09/20 A1c 8.2- on metformin ER 1000 mg qd, trulicity 3 mg injections once weekly, and glipizide 5 mg qd  Hyperlipidemia 07/30/19 TC 208, TG 278, HDL 42, LDL 118- on rosuvastatin 5 mg weekly   BP goal: <130/80 mmHg  Vitals . 03/24/20: 150/82 mmHg, pulse 82 . 02/25/20: 160/98 mmHg, pulse 83 . 01/22/20: 146/84 mmHg, pulse 76  Current HTN Medications: amlodipine 10 mg qd, telmisartan/HCTZ 80-25 mg, metoprolol succinate 25 mg qd  Allergies: sulfa antibiotics  Family Hx: Brother has CAD, DM, MI, and stroke. Sister has heart disease, PAD, and lung cancer  Social Hx: negative for smoking and alcohol use  Diet: Has started eating at home more and has increased vegetable intake. She has been more conscious about cutting back on canned foods and added salt. Her focus has been on portion control and only drinking 1 coke a day which she feels has been helping. Occasionally still gets takeout, but is aware of the impact it has on her BP. She had [place] for both lunch and dinner over the past 2 days which caused her to have a SBP in the  150s.   Physical activity: She just started PREP on Monday (11/15) and will be attending classes every Monday and Wednesday for the next 12 weeks. She was able to walk 1500 steps on her first day. She is going to work on her core strength.   Home BP Readings: Checked home monitor against clinic reading. Home machine read 156/82 mmHg vs manual reading of 150/82 mmHg. She has been keeping a BP log at home . Avg morning: 130/78 mmHg (116-138/64-86) . Avg evening: 144/82 mmHg (127-159/73-91)  Labs: last checked by PCP in March. SCr 0.86, BUN 19, ACR 36.9. Will check labs today to have a baseline reading of electrolytes and kidney function incase further medication changes need to occur at her next appt.   Assessment/Plan: Patient has made lifestyle changes since her last appt which has improved her BP, but she is still not at goal. SBP in the 150s today likely due to the [place] food she ate over the last 2 days. Home BP readings have been consistent on max dose of amlodipine and telmisartan/HCTZ. She is also on metoprolol succinate 25 mg daily, but this does not have great blood pressure lowering since it is a selective beta blocker. Plan is to switch her to a beta blocker  with mixed activity for better blood pressure control. Will discontinue her metoprolol succinate and start carvedilol 6.25 mg BID. Continue all other medications as previously prescribed.   Patient is going to have blood work drawn today. If BP is still uncontrolled at her next visit, can consider adding spironolactone if her electrolytes and kidney function are within normal range. Follow up with HTN clinic in 1 month.   Fanny Dance, PharmD Candidate [facility] [employer] 2022  Reviewed and approved by: Pearletha Furl PharmD, BCPS, CPP [facility] [address] [place] [address] 03/24/2020 1:08 PM

## 2020-03-24 NOTE — Patient Instructions (Addendum)
Return for a  follow up appointment in 4 weeks  Go to the lab in TODAY  Check your blood pressure at home daily (if able) and keep record of the readings.  Take your BP meds as follows: *STOP taking metoprolol* *START taking carvedilol 6.25mg  twice daily* *CONTINUE all other medication as previously prescribed*  Bring all of your meds, your BP cuff and your record of home blood pressures to your next appointment.  Exercise as you're able, try to walk approximately 30 minutes per day.  Keep salt intake to a minimum, especially watch canned and prepared boxed foods.  Eat more fresh fruits and vegetables and fewer canned items.  Avoid eating in fast food restaurants.    HOW TO TAKE YOUR BLOOD PRESSURE: . Rest 5 minutes before taking your blood pressure. .  Don't smoke or drink caffeinated beverages for at least 30 minutes before. . Take your blood pressure before (not after) you eat. . Sit comfortably with your back supported and both feet on the floor (don't cross your legs). . Elevate your arm to heart level on a table or a desk. . Use the proper sized cuff. It should fit smoothly and snugly around your bare upper arm. There should be enough room to slip a fingertip under the cuff. The bottom edge of the cuff should be 1 inch above the crease of the elbow. . Ideally, take 3 measurements at one sitting and record the average.

## 2020-03-29 NOTE — Progress Notes (Signed)
[facility] PREP Weekly Session   Patient Details  Name: RECHELLE NIEBLA MRN: [id number] Date of Birth: 1945/08/12 Age: 74 y.o. PCP: Marden Noble, MD  Vitals:   03/29/20 1625  BP: 126/78  Pulse: 76  Weight: 136 lb (61.7 kg)     [facility] Weekly seesion - 03/29/20 1600      Weekly Session   Topic Discussed Eating for the season;Importance of resistance training;Other ways to be active    Minutes exercised this week 80 minutes    Classes attended to date 3          Fun things since last meeting: did some Xmas shopping Grateful for: my health is improving 'my numbers'  Nutrition celebration: cooked more at home Barriers/struggles: eating takeout    Bonnye Fava 03/29/2020, 4:27 PM

## 2020-04-05 NOTE — Progress Notes (Signed)
[facility] PREP Weekly Session   Patient Details  Name: Sandra [name] MRN: [id number] Date of Birth: 1945-10-26 Age: 74 y.o. PCP: Marden Noble, MD  Vitals:   04/05/20 1614  Weight: 136 lb 3.2 oz (61.8 kg)     [facility] Weekly seesion - 04/05/20 1600      Weekly Session   Topic Discussed Health habits    Minutes exercised this week 90 minutes    Classes attended to date 5          Grateful for: not over eating Nutrition celebration: not many sweets over the holiday Barriers/struggles: not exercising as much   Bonnye Fava 04/05/2020, 4:14 PM

## 2020-04-06 ENCOUNTER — Telehealth: Payer: Self-pay | Admitting: Cardiovascular Disease

## 2020-04-06 MED ORDER — CARVEDILOL 6.25 MG PO TABS: 12.5000 mg | ORAL_TABLET | Freq: Two times a day (BID) | ORAL | 1 refills | Status: DC

## 2020-04-06 NOTE — Telephone Encounter (Signed)
Forward to [facility] Pharmacist who saw patient  -

## 2020-04-06 NOTE — Telephone Encounter (Signed)
Pt c/o medication issue:  1. Name of Medication: carvedilol (COREG) 6.25 MG tablet  2. How are you currently taking this medication (dosage and times per day)? 1 tablet (6.25 mg total) by mouth 2 (two) times daily  3. Are you having a reaction (difficulty breathing--STAT)? No   4. What is your medication issue? Patient is requesting to speak with Raquel. She states since switching from Metoprolol to Carvedilol, her BP has risen drastically. She would like to discuss an adjustment or an alternative.   Pt c/o BP issue: STAT if pt c/o blurred vision, one-sided weakness or slurred speech  1. What are your last 5 BP readings?  04/05/20: AM- BP 152/83 HR 80      PM- BP 162/89 HR 78  04/06/20: AM- BP 164/107 HR 91      AM- BP 156/86 HR 83  2. Are you having any other symptoms (ex. Dizziness, headache, blurred vision, passed out)? Dull headache   3. What is your BP issue? Patient assumes BP is elevated due to Carvedilol.

## 2020-04-06 NOTE — Telephone Encounter (Signed)
Patient instructed to increase carvedilol to 12.5mg  twice daily and keep follow up appointment in 2 weeks

## 2020-04-12 NOTE — Progress Notes (Signed)
[facility] PREP Weekly Session   Patient Details  Name: Sandra [name] MRN: [id number] Date of Birth: Apr 01, 1946 Age: 74 y.o. PCP: Marden Noble, MD  Vitals:   04/12/20 1612  Weight: 136 lb 3.2 oz (61.8 kg)     [facility] Weekly seesion - 04/12/20 1600      Weekly Session   Topic Discussed Health habits;Water   healthy eating tips last class    Minutes exercised this week 150 minutes    Classes attended to date 7          Fun things since last meeting" xmas wrapping Grateful for: my pressure lower Nutrition celebration: more vegs this week Barriers/struggles: recording my numbers    Bonnye Fava 04/12/2020, 4:13 PM

## 2020-04-16 ENCOUNTER — Other Ambulatory Visit: Payer: Self-pay | Admitting: Internal Medicine

## 2020-04-19 ENCOUNTER — Other Ambulatory Visit: Payer: Self-pay | Admitting: Cardiovascular Disease

## 2020-04-19 NOTE — Progress Notes (Signed)
[facility] PREP Weekly Session   Patient Details  Name: Sandra [name] MRN: [id number] Date of Birth: 11-14-1945 Age: 74 y.o. PCP: Marden Noble, MD  Vitals:   04/19/20 1630  Weight: 136 lb 6.4 oz (61.9 kg)     [facility] Weekly seesion - 04/19/20 1600      Weekly Session   Topic Discussed Restaurant Eating   salt demo   Minutes exercised this week 150 minutes    Classes attended to date 63          Fun things since last meeting: a beautiful Christmas church programs Grateful for: my health still improving Nutrition celebration: eating more vegetables Barriers/struggles: my daughter home, eating more   Bonnye Fava 04/19/2020, 4:30 PM

## 2020-04-21 ENCOUNTER — Other Ambulatory Visit: Payer: Self-pay

## 2020-04-21 ENCOUNTER — Ambulatory Visit (INDEPENDENT_AMBULATORY_CARE_PROVIDER_SITE_OTHER): Payer: Medicare PPO | Admitting: Pharmacist

## 2020-04-21 VITALS — BP 134/72 | HR 80 | Ht 62.0 in | Wt 134.8 lb

## 2020-04-21 DIAGNOSIS — I10 Essential (primary) hypertension: Secondary | ICD-10-CM

## 2020-04-21 MED ORDER — CARVEDILOL 12.5 MG PO TABS: 12.5000 mg | ORAL_TABLET | Freq: Two times a day (BID) | ORAL | 3 refills | Status: DC

## 2020-04-21 NOTE — Patient Instructions (Signed)
Return for a  follow up appointment with Dr Homer  Check your blood pressure at home daily (if able) and keep record of the readings.  Take your BP meds as follows: *NO MEDICATION CHANGE*   Bring all of your meds, your BP cuff and your record of home blood pressures to your next appointment.  Exercise as you're able, try to walk approximately 30 minutes per day.  Keep salt intake to a minimum, especially watch canned and prepared boxed foods.  Eat more fresh fruits and vegetables and fewer canned items.  Avoid eating in fast food restaurants.    HOW TO TAKE YOUR BLOOD PRESSURE: . Rest 5 minutes before taking your blood pressure. .  Don't smoke or drink caffeinated beverages for at least 30 minutes before. . Take your blood pressure before (not after) you eat. . Sit comfortably with your back supported and both feet on the floor (don't cross your legs). . Elevate your arm to heart level on a table or a desk. . Use the proper sized cuff. It should fit smoothly and snugly around your bare upper arm. There should be enough room to slip a fingertip under the cuff. The bottom edge of the cuff should be 1 inch above the crease of the elbow. . Ideally, take 3 measurements at one sitting and record the average.    

## 2020-04-21 NOTE — Progress Notes (Signed)
Patient ID: Sandra [name]                 DOB: 1945-07-18                      MRN: [id number]     HPI:  Sandra [name] is a 74 y.o. female referred by Dr. Oval Linsey to [facility] HTN clinic. PMH includes diabetes, hypertension, mild carotid stenosis, and hyperlipidemia. BP was controlled with same medication for over 20 years, until recently. She is exercising regularly with the PREP program and really like it. Noted BP trending upward after change from metoprolol to carvedilol 6.42m twice daily. We increased her carvedilol dose to 12.561mBID after follow up phone call on 04/06/2020 (2 weeks ago). Patient denies dizziness, increased fatigue, headaches, or shortness of breath.  Current HTN meds:  Amlodipine 1070maily Telmisartan/HCTZ 80/32m32mily Carvedilol 12.5mg 43mce daily  Intolerance: none  BP goal: <130/80  Family History: The patient's family history includes CAD in her brother; Diabetes in her brother; Heart attack in her brother; Heart disease in her sister; Lung cancer in her sister; Peripheral Artery Disease in her sister; Stroke in her brother and brother.  Social History: Denies tobacco, and alcohol abuse  Diet: eating more at home. Reduce appetite since Trulicity (down 10lb 24QAe), stopped getting chick-fil-a sandwiches, Chinese/Asia teriyaki chicken and vegetables.  Exercise: need to return call to schedule PREP section  Home BP readings:  13 home readings in last 2 weeks; average 143/84; HR range 76-88bpm  Wt Readings from Last 3 Encounters:  04/21/20 134 lb 12.8 oz (61.1 kg)  04/19/20 136 lb 6.4 oz (61.9 kg)  04/12/20 136 lb 3.2 oz (61.8 kg)   BP Readings from Last 3 Encounters:  04/21/20 134/72  03/29/20 126/78  03/24/20 (!) 150/82   Pulse Readings from Last 3 Encounters:  04/21/20 80  03/29/20 76  03/24/20 82    Past Medical History:  Diagnosis Date  . Pure hypercholesterolemia 01/22/2020    Current Outpatient Medications on File Prior to Visit   Medication Sig Dispense Refill  . amLODipine (NORVASC) 10 MG tablet Take 1 tablet (10 mg total) by mouth daily. 90 tablet 3  . Blood Glucose Monitoring Suppl (ONETOUCH VERIO) w/Device KIT Use to check blood sugar daily 1 kit 0  . Dulaglutide (TRULICITY) 3 MG/0.ST/4.1DQ Inject 3 mg into the skin once a week. 6 mL 3  . glipiZIDE (GLUCOTROL XL) 2.5 MG 24 hr tablet Take 2 tablets (5 mg total) by mouth daily with breakfast. 180 tablet 3  . glucose blood (ONETOUCH VERIO) test strip CHECK BLOOD SUGAR IN THE MORNING PRIOR TO BREAKFAST 100 strip 4  . Insulin Pen Needle 32G X 4 MM MISC Use to inject insulin weekly 100 each 5  . Lancets (ONETOUCH DELICA PLUS [id number] USE TO CHECK BLOOD SUGAR TWO TIMES A DAY. 100 each 2  . latanoprost (XALATAN) 0.005 % ophthalmic solution Place 1 drop into both eyes at bedtime.    . metFORMIN (GLUCOPHAGE-XR) 500 MG 24 hr tablet Take 2 tablets (1,000 mg total) by mouth daily with supper. 180 tablet 3  . MICARDIS HCT 80-25 MG per tablet Take 1 tablet by mouth daily.    . Multiple Vitamins-Minerals (WOMENS MULTIVITAMIN PO) Take by mouth daily.    . omeMarland Kitchenrazole (PRILOSEC) 20 MG capsule Take 20 mg by mouth daily.     No current facility-administered medications on file prior to visit.    Allergies  Allergen Reactions  . Sulfa Antibiotics Rash    Blood pressure 134/72, pulse 80, height '5\' 2"'  (1.575 m), weight 134 lb 12.8 oz (61.1 kg).  Essential (primary) hypertension Blood pressure remains above goal but significantly improved in the last 2 months. Patient continues to enjoy the PREP program and denies problems with current medication regimen. Recommendation given to further titrate carvedilol to 38m BID today, but she is reluctant to increase dose so quickly after last change 2 weeks ago.  Will continue current regimen without changes, continue to work on positive lifestyle modifications, and monitor BP twice daily. Plan to increase carvedilol to 269mBID during  follow up appointment if additional BP control needed.    Decker Cogdell Rodriguez-Guzman PharmD, BCPS, CPFlagler Beach[address] [id number]/23/2021 4:15 PM

## 2020-04-27 ENCOUNTER — Other Ambulatory Visit: Payer: Self-pay

## 2020-04-27 MED ORDER — CARVEDILOL 12.5 MG PO TABS: 12.5000 mg | ORAL_TABLET | Freq: Two times a day (BID) | ORAL | 3 refills | Status: DC

## 2020-04-29 ENCOUNTER — Encounter: Payer: Self-pay | Admitting: Pharmacist

## 2020-04-29 NOTE — Assessment & Plan Note (Signed)
Blood pressure remains above goal but significantly improved in the last 2 months. Patient continues to enjoy the PREP program and denies problems with current medication regimen. Recommendation given to further titrate carvedilol to 25mg  BID today, but she is reluctant to increase dose so quickly after last change 2 weeks ago.  Will continue current regimen without changes, continue to work on positive lifestyle modifications, and monitor BP twice daily. Plan to increase carvedilol to 25mg  BID during follow up appointment if additional BP control needed.

## 2020-05-04 NOTE — Progress Notes (Signed)
[facility] PREP Weekly Session   Patient Details  Name: Sandra [name] MRN: [id number] Date of Birth: 22-Nov-1945 Age: 74 y.o. PCP: Marden Noble, MD  Vitals:   05/03/20 1300  Weight: 135 lb 3.2 oz (61.3 kg)     [facility] Weekly seesion - 05/04/20 1100      Weekly Session   Topic Discussed Expectations and non-scale victories   class on 05/03/20 late entry   Minutes exercised this week 210 minutes    Classes attended to date 10          Fun things since last meeting: went on short trip Grateful for: family Nutrition celebration: found low sodium bacon Barriers/struggles: keeping recorded   Bonnye Fava 05/04/2020, 11:35 AM

## 2020-05-05 ENCOUNTER — Other Ambulatory Visit: Payer: Self-pay

## 2020-05-05 DIAGNOSIS — Z79899 Other long term (current) drug therapy: Secondary | ICD-10-CM

## 2020-05-05 DIAGNOSIS — I10 Essential (primary) hypertension: Secondary | ICD-10-CM

## 2020-05-06 ENCOUNTER — Telehealth: Payer: Self-pay | Admitting: Cardiovascular Disease

## 2020-05-06 NOTE — Telephone Encounter (Signed)
Patient was told to f/u with Dr. Duke Salvia in the [facility]. Patient was not sure how soon to f/u. Please advise

## 2020-05-06 NOTE — Telephone Encounter (Signed)
Left message for patient the last office note did not say how soon to follow up and dr Duke Salvia and nurse are out of the office but will send Sandra [name] the note to get in touch with her to schedule follow up.

## 2020-05-10 NOTE — Telephone Encounter (Signed)
Spoke with patient and scheduled follow up visit.

## 2020-05-12 ENCOUNTER — Other Ambulatory Visit: Payer: Self-pay | Admitting: *Deleted

## 2020-05-12 DIAGNOSIS — Z5181 Encounter for therapeutic drug level monitoring: Secondary | ICD-10-CM

## 2020-05-12 DIAGNOSIS — I10 Essential (primary) hypertension: Secondary | ICD-10-CM

## 2020-05-17 NOTE — Progress Notes (Signed)
[facility] PREP Weekly Session   Patient Details  Name: Sandra [name] MRN: [id number] Date of Birth: 1945-12-26 Age: 75 y.o. PCP: Marden Noble, MD  Vitals:   05/17/20 1441  Weight: 134 lb (60.8 kg)     [facility] Weekly seesion - 05/17/20 1400      Weekly Session   Topic Discussed Finding support    Minutes exercised this week 120 minutes    Classes attended to date 42          Fun things since last meeting: enjoying my daughter being home Grateful for: my blood pressure being much better Nutrition celebration: still drinking water Barriers/struggles: Allegra Lai 05/17/2020, 2:41 PM

## 2020-05-31 NOTE — Progress Notes (Signed)
[facility] PREP Weekly Session   Patient Details  Name: Sandra [name] MRN: [id number] Date of Birth: May 12, 1945 Age: 75 y.o. PCP: Marden Noble, MD  Vitals:   05/31/20 1600  Weight: 134 lb (60.8 kg)     [facility] Weekly seesion - 05/31/20 1600      Weekly Session   Topic Discussed Calorie breakdown    Minutes exercised this week 120 minutes    Classes attended to date 59          Fun things since last meeting: beautiful snow Grateful for: health Nutrition celebration: eating better Barriers/struggles: some wrong food choices   Bonnye Fava 05/31/2020, 4:01 PM

## 2020-06-02 ENCOUNTER — Ambulatory Visit: Payer: Medicare PPO | Admitting: Cardiovascular Disease

## 2020-06-02 ENCOUNTER — Encounter: Payer: Self-pay | Admitting: Cardiovascular Disease

## 2020-06-02 ENCOUNTER — Other Ambulatory Visit: Payer: Self-pay

## 2020-06-02 VITALS — BP 132/78 | HR 89 | Ht 62.0 in | Wt 133.0 lb

## 2020-06-02 DIAGNOSIS — I10 Essential (primary) hypertension: Secondary | ICD-10-CM

## 2020-06-02 DIAGNOSIS — I6523 Occlusion and stenosis of bilateral carotid arteries: Secondary | ICD-10-CM | POA: Diagnosis not present

## 2020-06-02 DIAGNOSIS — E7800 Pure hypercholesterolemia, unspecified: Secondary | ICD-10-CM

## 2020-06-02 DIAGNOSIS — Z5181 Encounter for therapeutic drug level monitoring: Secondary | ICD-10-CM

## 2020-06-02 NOTE — Progress Notes (Signed)
Hypertension Clinic Follow up Assessment:    Date:  06/02/2020   ID:  Sandra [name], DOB [birth year], MRN [id number]  PCP:  Josetta Huddle, MD  Cardiologist:  No primary care provider on file.  Nephrologist:  Referring MD: Josetta Huddle, MD   CC: Hypertension  History of Present Illness:    Sandra [name] is a 75 y.o. female with a hx of diabetes, hypertension, mild carotid stenosis, and hyperlipidemia here for follow-up.  She initially established care 01/2020 in the advanced hypertension clinic. She has been on her same BP medication for 25 years.  Her blood pressure was pretty stable.  However recently she saw Dr. Inda Merlin because she started experiencing tinnitus.  When he saw her her blood pressure was in the 150s over 90s.  She had a screening a couple weeks prior and her blood pressure was elevated then as well.  She was started on amlodipine 5 mg.  Her blood pressure remained elevated so a few days ago metoprolol 25 mg was added.    At her initial appointment metoprolol was discontinued and amlodipine was increased.  She was also enrolled in the PREP program through the [facility].  At home her BP has been mostly been well-controlled.  Before her morning medication it is in the 120s-130s/70-80s.  She has been tolerating her medications.  She lost 3 lb and is enjoying the program at the [facility] and is working on changing her diet.  She enjoys exercising and has no chest pain or shortness of breath.  She is concerned about the small change in her renal function.  She has several family members with kidney failure.  She has no LE edema, orthopnea or PND.   Past Medical History:  Diagnosis Date  . Pure hypercholesterolemia 01/22/2020    No past surgical history on file.  Current Medications: Current Meds  Medication Sig  . amLODipine (NORVASC) 10 MG tablet Take 1 tablet (10 mg total) by mouth daily.  Marland Kitchen atorvastatin (LIPITOR) 10 MG tablet Take 10 mg by mouth as directed. TAKE 1 TABLET  ONCE A WEEK  . Blood Glucose Monitoring Suppl (ONETOUCH VERIO) w/Device KIT Use to check blood sugar daily  . carvedilol (COREG) 12.5 MG tablet Take 1 tablet (12.5 mg total) by mouth 2 (two) times daily.  . Dulaglutide (TRULICITY) 3 MD/4.7WL SOPN Inject 3 mg into the skin once a week.  Marland Kitchen glipiZIDE (GLUCOTROL XL) 2.5 MG 24 hr tablet Take 2 tablets (5 mg total) by mouth daily with breakfast.  . glucose blood (ONETOUCH VERIO) test strip CHECK BLOOD SUGAR IN THE MORNING PRIOR TO BREAKFAST  . Insulin Pen Needle 32G X 4 MM MISC Use to inject insulin weekly  . Lancets (ONETOUCH DELICA PLUS [id number]) MISC USE TO CHECK BLOOD SUGAR TWO TIMES A DAY.  Marland Kitchen latanoprost (XALATAN) 0.005 % ophthalmic solution Place 1 drop into both eyes at bedtime.  . metFORMIN (GLUCOPHAGE-XR) 500 MG 24 hr tablet Take 2 tablets (1,000 mg total) by mouth daily with supper.  Marland Kitchen MICARDIS HCT 80-25 MG per tablet Take 1 tablet by mouth daily.  . Multiple Vitamins-Minerals (WOMENS MULTIVITAMIN PO) Take by mouth daily.  Marland Kitchen omeprazole (PRILOSEC) 20 MG capsule Take 20 mg by mouth daily.     Allergies:   Metformin hcl er and Sulfa antibiotics   Social History   Socioeconomic History  . Marital status: Married    Spouse name: Not on file  . Number of children: Not on file  . Years  of education: Not on file  . Highest education level: Not on file  Occupational History  . Not on file  Tobacco Use  . Smoking status: Never Smoker  . Smokeless tobacco: Never Used  Substance and Sexual Activity  . Alcohol use: Not on file  . Drug use: Not on file  . Sexual activity: Not on file  Other Topics Concern  . Not on file  Social History Narrative  . Not on file   Social Determinants of Health   Financial Resource Strain: Not on file  Food Insecurity: Not on file  Transportation Needs: Not on file  Physical Activity: Not on file  Stress: Not on file  Social Connections: Not on file     Family History: The patient's family  history includes CAD in her brother; Diabetes in her brother; Heart attack in her brother; Heart disease in her sister; Lung cancer in her sister; Peripheral Artery Disease in her sister; Stroke in her brother and brother.  ROS:   Please see the history of present illness.     All other systems reviewed and are negative.  EKGs/Labs/Other Studies Reviewed:    EKG:  EKG is not ordered today.  The ekg ordered 01/22/20 demonstrates sinus rhythm.  Rate 76 bpm.    Recent Labs: 03/24/2020: BUN 21; Creatinine, Ser 1.03; Potassium 4.6; Sodium 137   Recent Lipid Panel No results found for: CHOL, TRIG, HDL, CHOLHDL, VLDL, LDLCALC, LDLDIRECT  Physical Exam:    VS:  BP 132/78   Pulse 89   Ht '5\' 2"'  (1.575 m)   Wt 133 lb (60.3 kg)   SpO2 95%   BMI 24.33 kg/m  , BMI Body mass index is 24.33 kg/m. GENERAL:  Well appearing HEENT: Pupils equal round and reactive, fundi not visualized, oral mucosa unremarkable NECK:  No jugular venous distention, waveform within normal limits, carotid upstroke brisk and symmetric, no bruits LUNGS:  Clear to auscultation bilaterally HEART:  RRR.  PMI not displaced or sustained,S1 and S2 within normal limits, no S3, no S4, no clicks, no rubs, no murmurs ABD:  Flat, positive bowel sounds normal in frequency in pitch, no bruits, no rebound, no guarding, no midline pulsatile mass, no hepatomegaly, no splenomegaly EXT:  2 plus pulses throughout, no edema, no cyanosis no clubbing SKIN:  No rashes no nodules NEURO:  Cranial nerves II through XII grossly intact, motor grossly intact throughout PSYCH:  Cognitively intact, oriented to person place and time  ASSESSMENT:    1. Bilateral carotid artery stenosis   2. Essential (primary) hypertension   3. Therapeutic drug monitoring   4. Pure hypercholesterolemia     PLAN:    # Essential hypertension:  BP was stable on telmisartan/HCTZ for years.  It has been better controlled since adding amlodipine and carvedilol.  She  is making a lot of healthy lifestyle changes in her diet and exercising.  She is encouraged to keep this up.  Pressures slightly above her goal of 130/80.  Typically is only up 1 or 2 above this goal before she takes her medications.  I suspect it is better controlled later in the day.  Given that she is "so close we will not make any other medication changes for now and continue to monitor.  Check a comprehensive c metabolic panel today to assess her renal function.   # Carotid stenosis:  # Hyperlipidemia:   She takes atorvastatin once per week.  She sometimes forgets to take the dose with plans  to start taking it when she uses her Trulicity.  Check fasting lipids and a CMP today.  Disposition:    FU with MD/PharmD in 6  months    Medication Adjustments/Labs and Tests Ordered: Current medicines are reviewed at length with the patient today.  Concerns regarding medicines are outlined above.  Orders Placed This Encounter  Procedures  . Comprehensive metabolic panel  . Lipid panel   No orders of the defined types were placed in this encounter.    Signed, Skeet Latch, MD  06/02/2020 1:40 PM    [facility]

## 2020-06-02 NOTE — Patient Instructions (Addendum)
Medication Instructions:  Your physician recommends that you continue on your current medications as directed. Please refer to the Current Medication list given to you today.   Labwork: LP/CMET TODAY   Testing/Procedures: NONE  Follow-Up: 6/13 AT 9:45 AM WITH DR [facility]   If you need a refill on your cardiac medications before your next appointment, please call your pharmacy.

## 2020-06-03 LAB — COMPREHENSIVE METABOLIC PANEL
ALT: 15 IU/L (ref 0–32)
AST: 17 IU/L (ref 0–40)
Albumin/Globulin Ratio: 1.6 (ref 1.2–2.2)
Albumin: 4.7 g/dL (ref 3.7–4.7)
Alkaline Phosphatase: 86 IU/L (ref 44–121)
BUN/Creatinine Ratio: 21 (ref 12–28)
BUN: 20 mg/dL (ref 8–27)
Bilirubin Total: 0.3 mg/dL (ref 0.0–1.2)
CO2: 22 mmol/L (ref 20–29)
Calcium: 10 mg/dL (ref 8.7–10.3)
Chloride: 100 mmol/L (ref 96–106)
Creatinine, Ser: 0.95 mg/dL (ref 0.57–1.00)
GFR calc Af Amer: 69 mL/min/{1.73_m2} (ref >59)
GFR calc non Af Amer: 60 mL/min/{1.73_m2} (ref >59)
Globulin, Total: 3 g/dL (ref 1.5–4.5)
Glucose: 169 mg/dL — ABNORMAL HIGH (ref 65–99)
Potassium: 4.2 mmol/L (ref 3.5–5.2)
Sodium: 140 mmol/L (ref 134–144)
Total Protein: 7.7 g/dL (ref 6.0–8.5)

## 2020-06-03 LAB — LIPID PANEL
Chol/HDL Ratio: 5.2 ratio — ABNORMAL HIGH (ref 0.0–4.4)
Cholesterol, Total: 218 mg/dL — ABNORMAL HIGH (ref 100–199)
HDL: 42 mg/dL (ref >39)
LDL Chol Calc (NIH): 135 mg/dL — ABNORMAL HIGH (ref 0–99)
Triglycerides: 226 mg/dL — ABNORMAL HIGH (ref 0–149)
VLDL Cholesterol Cal: 41 mg/dL — ABNORMAL HIGH (ref 5–40)

## 2020-06-10 ENCOUNTER — Ambulatory Visit: Payer: Medicare PPO | Admitting: Internal Medicine

## 2020-06-10 NOTE — Progress Notes (Deleted)
Patient ID: Sandra [name], female   DOB: 12-Jul-1945, 75 y.o.   MRN: [id number]   This visit occurred during the SARS-CoV-2 public health emergency.  Safety protocols were in place, including screening questions prior to the visit, additional usage of staff PPE, and extensive cleaning of exam room while observing appropriate contact time as indicated for disinfecting solutions.   HPI: Sandra [name] is a 75 y.o.-year-old female, returning for f/u for DM2, dx in 2015, non-insulin-dependent, uncontrolled, with long term complications ([facility]). Last visit 3 months ago.  Reviewed HbA1c levels: Lab Results  Component Value Date   HGBA1C 8.5 (A) 03/09/2020   HGBA1C 7.4 (A) 11/04/2019   HGBA1C 8.1 (A) 07/17/2019  07/30/2019: HbA1c 8.3% 10/2015: HbA1c 8.2%  Pt is on a regimen of: - Metformin ER 500 mg at bedtime >> stopped 2/2 recall 3 weeks >> restarted 500 mg with dinner -  (did not take it as the sugars were controlled...) >> restarted 5 mg before breakfast - Trulicity 1.5 mg >> 3 mg weekly-no GI side effects She was on Jardiance 10 mg daily in am - started 07/2016 >> could not tolerate it b/c increased urination and hyperglycemia  She was on Metformin 500 mg 1x a day with dinner (500 mg tid >> AP/diarrhea)  >> stopped when started Januvia  Pt checks her sugars once a day: - am: 160-200, 212 >> 110-178 (ave 150) >> 148-220 - 2h after b'fast:  170s >> 135-161, 258 >> n/c - before lunch:  125 >> 150s >> 160-180 >> n/c - 2h after lunch:  142 >> n/c >> 125-158 >> 200 - before dinner: 128-165 >> n/c >> 196 >> n/c - 2h after dinner: n/c >> 137 >> n/c - bedtime: 160-180 >> 125-130 >> 163, 164 - nighttime: n/c >> 169-208 >> n/c It is unclear at which CBG level she has hypoglycemia awareness. Highest CBG: 201 x1 > 220.  Glucometer: OneTouch  No decrease GFR but she does have microalbuminuria: Lab Results  Component Value Date   BUN 20 06/02/2020   BUN 21 03/24/2020   CREATININE 0.95  06/02/2020   CREATININE 1.03 (H) 03/24/2020   Lab Results  Component Value Date   MICRALBCREAT 5.6 11/11/2018  07/30/2019: ACR 36.9, BUN/creatinine 19/0.86 07/09/2018: glucose of 173.  BUN/creatinine 13/0.75, GFR 92, ACR 96.3 07/04/2017: 21/0.73, ACR not checked 01/12/2017: Glu 181, 18/0.8, eGFR 86, ACR 42.1 10/22/2015: 21/0.79, ACR 35.6 On Micardis.  + HL; last set of lipids: Lab Results  Component Value Date   CHOL 218 (H) 06/02/2020   HDL 42 06/02/2020   LDLCALC 135 (H) 06/02/2020   TRIG 226 (H) 06/02/2020   CHOLHDL 5.2 (H) 06/02/2020  07/30/2019: 208/278/42/118 07/09/2018: 195/250/40/106 07/04/2017: 207/381/40/91 01/12/2017: 199/380/41/82 10/22/2015: 226/201/48/138  Previously on rosuvastatin 5 mg weekly, now changed to Lipitor 10 weekly.  - last eye exam was in 06/2019: No DR but worsened intraocular pressure.  On eyedrops.  She sees Dr. Prudencio Burly.  She has a history of cataract surgery.  - no numbness and tingling in her feet.   She had worsening hypertension since last visit and added Amlodipine.  ROS: Constitutional: no weight gain/no weight loss, no fatigue, no subjective hyperthermia, no subjective hypothermia Eyes: no blurry vision, no xerophthalmia ENT: no sore throat, no nodules palpated in neck, no dysphagia, no odynophagia, no hoarseness Cardiovascular: no CP/no SOB/no palpitations/no leg swelling Respiratory: no cough/no SOB/no wheezing Gastrointestinal: no N/no V/no D/no C/no acid reflux Musculoskeletal: no muscle aches/no joint aches Skin: no  rashes, no hair loss Neurological: no tremors/no numbness/no tingling/no dizziness  I reviewed pt's medications, allergies, PMH, social hx, family hx, and changes were documented in the history of present illness. Otherwise, unchanged from my initial visit note.  Past medical history: Patient Active Problem List   Diagnosis Date Noted  . Microalbuminuria 12/11/2016    Priority: High  . Type 2 diabetes mellitus with  hyperglycemia ([place]) 12/15/2015    Priority: High  . Dyslipidemia 02/25/2018    Priority: Medium  . Carotid stenosis 01/22/2020  . Pure hypercholesterolemia 01/22/2020  . 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 Cervical pain 12/15/2015  . Diverticulitis of colon 12/15/2015  . Acid reflux 12/15/2015  . Essential (primary) hypertension 12/15/2015  . Benign breast lumps 12/15/2015  . Knee pain 11/08/2011   Social History   Social History  . Marital status: Married    Spouse name: N/A  . Number of children: 2   Occupational History  . Retired    Social History Main Topics  . Smoking status: Never Smoker  . Smokeless tobacco: Never Used  . Alcohol use Socially   . Drug use: No    Current Outpatient Medications on File Prior to Visit  Medication Sig Dispense Refill  . amLODipine (NORVASC) 10 MG tablet Take 1 tablet (10 mg total) by mouth daily. 90 tablet 3  . atorvastatin (LIPITOR) 10 MG tablet Take 10 mg by mouth as directed. TAKE 1 TABLET ONCE A WEEK    . Blood Glucose Monitoring Suppl (ONETOUCH VERIO) w/Device KIT Use to check blood sugar daily 1 kit 0  . carvedilol (COREG) 12.5 MG tablet Take 1 tablet (12.5 mg total) by mouth 2 (two) times daily. 90 tablet 3  . Dulaglutide (TRULICITY) 3 KV/4.2VZ SOPN Inject 3 mg into the skin once a week. 6 mL 3  . glipiZIDE (GLUCOTROL XL) 2.5 MG 24 hr tablet Take 2 tablets (5 mg total) by mouth daily with breakfast. 180 tablet 3  . glucose blood (ONETOUCH VERIO) test strip CHECK BLOOD SUGAR IN THE MORNING PRIOR TO BREAKFAST 100 strip 4  . Insulin Pen Needle 32G X 4 MM MISC Use to inject insulin weekly 100 each 5  . Lancets (ONETOUCH DELICA PLUS [id number]) MISC USE TO CHECK BLOOD SUGAR TWO TIMES A DAY. 100 each 2  . latanoprost (XALATAN) 0.005 % ophthalmic solution Place 1 drop into both eyes at bedtime.    . metFORMIN (GLUCOPHAGE-XR) 500 MG 24 hr tablet Take 2 tablets (1,000 mg total) by mouth daily with supper. 180 tablet 3  .  MICARDIS HCT 80-25 MG per tablet Take 1 tablet by mouth daily.    . Multiple Vitamins-Minerals (WOMENS MULTIVITAMIN PO) Take by mouth daily.    Marland Kitchen omeprazole (PRILOSEC) 20 MG capsule Take 20 mg by mouth daily.     No current facility-administered medications on file prior to visit.    Allergies  Allergen Reactions  . Metformin Hcl Er Other (See Comments)  . Sulfa Antibiotics Rash   Family History  Problem Relation Age of Onset  . Peripheral Artery Disease Sister   . Stroke Brother   . Diabetes Brother   . Heart attack Brother   . Lung cancer Sister   . Stroke Brother   . CAD Brother   . Heart disease Sister     PE: There were no vitals taken for this visit. Wt Readings from Last 3 Encounters:  06/02/20 133 lb (60.3 kg)  05/31/20 134 lb (60.8 kg)  05/17/20 134  lb (60.8 kg)   Constitutional: normal weight, in NAD Eyes: PERRLA, EOMI, no exophthalmos ENT: moist mucous membranes, no thyromegaly, no cervical lymphadenopathy Cardiovascular: RRR, No MRG Respiratory: CTA B Gastrointestinal: abdomen soft, NT, ND, BS+ Musculoskeletal: no deformities, strength intact in all 4 Skin: moist, warm, no rashes Neurological: no tremor with outstretched hands, DTR normal in all 4  ASSESSMENT: 1. DM2, non-insulin-dependent, uncontrolled, without long term complications, but with hyperglycemia  2. Microalbuminuria  3. HL  PLAN:  1. Patient with longstanding, uncontrolled, type 2 diabetes, with improved control on Metformin ER and Trulicity, but she was off her sulfonylurea at last visit and sugars were mostly above goal, especially in the morning.  We restarted glipizide XL before breakfast and I advised her to try to increase the Metformin dose.  HbA1c at last visit was higher, 8.5%.  - I suggested to:  Patient Instructions  Please continue: - Metformin ER 500 mg with dinner (try to add another tablet if tolerated) - Trulicity 3 mg weekly - Glipizide XL 5 mg before b'fast  Please  return in 3-4 months with your sugar log.   - we checked her HbA1c: 7%  - advised to check sugars at different times of the day - 1x a day, rotating check times - advised for yearly eye exams >> she is UTD - return to clinic in 3-4 months  2. Microalbuminuria -Likely due to diabetes -ACR was high more than a year ago and again in 07/2019: 36.9 -Continues on Micardis 80  3. HL -Reviewed latest lipid panel from 07/2019: Triglycerides high, LDL above goal:208/278/42/118 -Before last visit, she just started rosuvastatin 5 mg weekly. -However, a lipid panel was rechecked several days ago and the LDL was actually higher: Lab Results  Component Value Date   CHOL 218 (H) 06/02/2020   HDL 42 06/02/2020   LDLCALC 135 (H) 06/02/2020   TRIG 226 (H) 06/02/2020   CHOLHDL 5.2 (H) 06/02/2020  -After the above results returned, she was switched to Lipitor 10 weekly by PCP -she was advised to increase to 3 times week  Philemon Kingdom, MD PhD [facility] Endocrinology

## 2020-06-23 NOTE — Progress Notes (Signed)
[facility] PREP Progress Report   Patient Details  Name: ANJALINA BERGEVIN MRN: [id number] Date of Birth: 02/08/1946 Age: 75 y.o. PCP: Marden Noble, MD  Vitals:   06/23/20 1226  BP: (!) 154/82  Pulse: 80  SpO2: 97%  Weight: 132 lb 6.4 oz (60.1 kg)      [facility] Eval - 06/23/20 1200      Referral    Referring Provider [facility] Date --   Final Class 06/21/20     Measurement   Waist Circumference 34.5 inches    Hip Circumference 36 inches    Body fat 34.7 percent      Information for Trainer   Goals --   Next 90 day goals reviewed     Mobility and Daily Activities   I find it easy to walk up or down two or more flights of stairs. 3    I have no trouble taking out the trash. 4    I do housework such as vacuuming and dusting on my own without difficulty. 4    I can easily lift a gallon of milk (8lbs). 4    I can easily walk a mile. 4    I have no trouble reaching into high cupboards or reaching down to pick up something from the floor. 3    I do not have trouble doing out-door work such as Loss adjuster, chartered, raking leaves, or gardening. 3      Mobility and Daily Activities   I feel younger than my age. 4    I feel independent. 4    I feel energetic. 3    I live an active life.  4    I feel strong. 4    I feel healthy. 3    I feel active as other people my age. 4      How fit and strong are you.   Fit and Strong Total Score 51          Past Medical History:  Diagnosis Date  . Pure hypercholesterolemia 01/22/2020   No past surgical history on file. Social History   Tobacco Use  Smoking Status Never Smoker  Smokeless Tobacco Never Used     Reduction in weight, body fat percentage.Gains in strength in particular    Bonnye Fava 06/23/2020, 12:27 PM

## 2020-07-01 ENCOUNTER — Other Ambulatory Visit: Payer: Self-pay

## 2020-07-01 ENCOUNTER — Encounter: Payer: Self-pay | Admitting: Internal Medicine

## 2020-07-01 ENCOUNTER — Ambulatory Visit: Payer: Medicare PPO | Admitting: Internal Medicine

## 2020-07-01 VITALS — BP 120/82 | HR 89 | Ht 62.0 in | Wt 132.6 lb

## 2020-07-01 DIAGNOSIS — R809 Proteinuria, unspecified: Secondary | ICD-10-CM

## 2020-07-01 DIAGNOSIS — E785 Hyperlipidemia, unspecified: Secondary | ICD-10-CM | POA: Diagnosis not present

## 2020-07-01 DIAGNOSIS — E1165 Type 2 diabetes mellitus with hyperglycemia: Secondary | ICD-10-CM

## 2020-07-01 LAB — POCT GLYCOSYLATED HEMOGLOBIN (HGB A1C): Hemoglobin A1C: 6.6 % — AB (ref 4.0–5.6)

## 2020-07-01 NOTE — Patient Instructions (Addendum)
Please continue: - Metformin ER 500 mg with dinner - Trulicity 3 mg weekly - Glipizide XL 2.5 mg before b'fast  Please return in 3-4 months with your sugar log.

## 2020-07-01 NOTE — Progress Notes (Signed)
Patient ID: [name], female   DOB: 07/19/1945, 75 y.o.   MRN: [id number]   This visit occurred during the SARS-CoV-2 public health emergency.  Safety protocols were in place, including screening questions prior to the visit, additional usage of staff PPE, and extensive cleaning of exam room while observing appropriate contact time as indicated for disinfecting solutions.   HPI: Sandra [name] is a 75 y.o.-year-old female, returning for f/u for DM2, dx in 2015, non-insulin-dependent, uncontrolled, with long term complications (MAU). Last visit 4 months ago.  She just finished a 12 weeks exercise and nutritional pgm. (with cardiology).  Blood pressure improved after this.  She felt that her sugars also improved on this program but she could not check in the last month as she did not know how to use her glucometer.  Her brother died 2 weeks ago from renal disease.  She missed our appointment at that time and rescheduled today.  Reviewed HbA1c levels: Lab Results  Component Value Date   HGBA1C 8.5 (A) 03/09/2020   HGBA1C 7.4 (A) 11/04/2019   HGBA1C 8.1 (A) 07/17/2019   HGBA1C 7.0 (A) 03/13/2019   HGBA1C 6.8 (A) 11/11/2018   HGBA1C 7.6 (A) 07/01/2018   HGBA1C 8.2 (A) 02/25/2018   HGBA1C 9.2 (A) 10/12/2017   HGBA1C 7.4 06/13/2017   HGBA1C 7.4 03/13/2017   HGBA1C 7.5 12/11/2016   HGBA1C 7.7 07/10/2016   HGBA1C 7.3 04/10/2016   HGBA1C 7.7 12/15/2015  07/30/2019: HbA1c 8.3% 10/2015: HbA1c 8.2%  Pt is on a regimen of: - Metformin ER 500 mg at bedtime >> stopped 2/2 recall 3 weeks >> restarted 500 mg with dinner and  -  (did not take it as the sugars were controlled...) >> Restarted 5 mg before breakfast >> did not remember: taking 2.5 mg daily - Trulicity 1.5 mg >> 3 mg weekly-no GI side effects She was on Jardiance 10 mg daily in am - started 07/2016 >> could not tolerate it b/c increased urination and hyperglycemia  She was on Metformin 500 mg 1x a day with dinner (500 mg tid  >> AP/diarrhea)  >> stopped when started Januvia  Pt checks her sugars  0-1x a day (up to 1 mo ago) - am: 160-200, 212 >> 110-178 (ave 150) >> 148-220 >> 130-199 - 2h after b'fast:  170s >> 135-161, 258 >> n/c - before lunch:  125 >> 150s >> 160-180 >> n/c - 2h after lunch:  142 >> n/c >> 125-158 >> 200 >> n/c - before dinner: 128-165 >> n/c >> 196 >> n/c - 2h after dinner: n/c >> 137 >> n/c - bedtime: 160-180 >> 125-130 >> 163, 164 >> 150s-160s - nighttime: n/c >> 169-208 >> n/c It is unclear at which CBG level she has hypoglycemia awareness. Highest CBG: 201 x1 > 220 >> 199.  Glucometer: OneTouch >> AccuChek  Her GFR is not decreased, but she has microalbuminuria: Lab Results  Component Value Date   BUN 20 06/02/2020   BUN 21 03/24/2020   CREATININE 0.95 06/02/2020   CREATININE 1.03 (H) 03/24/2020   Lab Results  Component Value Date   MICRALBCREAT 5.6 11/11/2018  07/30/2019: ACR 36.9, BUN/creatinine 19/0.86 07/09/2018: glucose of 173.  BUN/creatinine 13/0.75, GFR 92, ACR 96.3 07/04/2017: 21/0.73, ACR not checked 01/12/2017: Glu 181, 18/0.8, eGFR 86, ACR 42.1 10/22/2015: 21/0.79, ACR 35.6 On Mycardis 80.  + HL; last set of lipids: Lab Results  Component Value Date   CHOL 218 (H) 06/02/2020   HDL 42 06/02/2020  [place] 135 (H) 06/02/2020   TRIG 226 (H) 06/02/2020   CHOLHDL 5.2 (H) 06/02/2020  07/30/2019: 208/278/42/118 07/09/2018: 195/250/40/106 07/04/2017: 207/381/40/91 01/12/2017: 199/380/41/82 10/22/2015: 226/201/48/138  Previously on rosuvastatin 5 mg weekly, now changed to Lipitor 10 mg weekly.  - last eye exam was in 06/2019: No DR but worsened intraocular pressure.  On eyedrops.  She sees Dr. Prudencio Burly.  She has a history of cataract surgery.  - no numbness and tingling in her feet.   Before last visit she had worsening hypertension amlodipine.  She increase the dose of this and also of Coreg since last visit.  ROS: Constitutional: no weight gain/no weight loss, no  fatigue, no subjective hyperthermia, no subjective hypothermia Eyes: no blurry vision, no xerophthalmia ENT: no sore throat, no nodules palpated in neck, no dysphagia, no odynophagia, no hoarseness Cardiovascular: no CP/no SOB/no palpitations/no leg swelling Respiratory: no cough/no SOB/no wheezing Gastrointestinal: no N/no V/no D/no C/no acid reflux Musculoskeletal: no muscle aches/no joint aches Skin: no rashes, no hair loss Neurological: no tremors/no numbness/no tingling/no dizziness  I reviewed pt's medications, allergies, PMH, social hx, family hx, and changes were documented in the history of present illness. Otherwise, unchanged from my initial visit note.  Past medical history: Patient Active Problem List   Diagnosis Date Noted  . Microalbuminuria 12/11/2016    Priority: High  . Type 2 diabetes mellitus with hyperglycemia ([place]) 12/15/2015    Priority: High  . Dyslipidemia 02/25/2018    Priority: Medium  . Carotid stenosis 01/22/2020  . Pure hypercholesterolemia 01/22/2020  . 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 Cervical pain 12/15/2015  . Diverticulitis of colon 12/15/2015  . Acid reflux 12/15/2015  . Essential (primary) hypertension 12/15/2015  . Benign breast lumps 12/15/2015  . Knee pain 11/08/2011   Social History   Social History  . Marital status: Married    Spouse name: N/A  . Number of children: 2   Occupational History  . Retired    Social History Main Topics  . Smoking status: Never Smoker  . Smokeless tobacco: Never Used  . Alcohol use Socially   . Drug use: No    Current Outpatient Medications on File Prior to Visit  Medication Sig Dispense Refill  . amLODipine (NORVASC) 10 MG tablet Take 1 tablet (10 mg total) by mouth daily. 90 tablet 3  . atorvastatin (LIPITOR) 10 MG tablet Take 10 mg by mouth as directed. TAKE 1 TABLET ONCE A WEEK    . Blood Glucose Monitoring Suppl (ONETOUCH VERIO) w/Device KIT Use to check blood sugar daily 1  kit 0  . carvedilol (COREG) 12.5 MG tablet Take 1 tablet (12.5 mg total) by mouth 2 (two) times daily. 90 tablet 3  . Dulaglutide (TRULICITY) 3 SH/7.73YO SOPN Inject 3 mg into the skin once a week. 6 mL 3  . glipiZIDE (GLUCOTROL XL) 2.5 MG 24 hr tablet Take 2 tablets (5 mg total) by mouth daily with breakfast. 180 tablet 3  . glucose blood (ONETOUCH VERIO) test strip CHECK BLOOD SUGAR IN THE MORNING PRIOR TO BREAKFAST 100 strip 4  . Insulin Pen Needle 32G X 4 MM MISC Use to inject insulin weekly 100 each 5  . Lancets (ONETOUCH DELICA PLUS [id number]) MISC USE TO CHECK BLOOD SUGAR TWO TIMES A DAY. 100 each 2  . latanoprost (XALATAN) 0.005 % ophthalmic solution Place 1 drop into both eyes at bedtime.    . metFORMIN (GLUCOPHAGE-XR) 500 MG 24 hr tablet Take 2 tablets (1,000 mg total) by mouth  daily with supper. 180 tablet 3  . MICARDIS HCT 80-25 MG per tablet Take 1 tablet by mouth daily.    . Multiple Vitamins-Minerals (WOMENS MULTIVITAMIN PO) Take by mouth daily.    Marland Kitchen omeprazole (PRILOSEC) 20 MG capsule Take 20 mg by mouth daily.     No current facility-administered medications on file prior to visit.    Allergies  Allergen Reactions  . Metformin Hcl Er Other (See Comments)  . Sulfa Antibiotics Rash   Family History  Problem Relation Age of Onset  . Peripheral Artery Disease Sister   . Stroke Brother   . Diabetes Brother   . Heart attack Brother   . Lung cancer Sister   . Stroke Brother   . CAD Brother   . Heart disease Sister     PE: BP 120/82   Pulse 89   Ht '5\' 2"'  (1.575 m)   Wt 132 lb 9.6 oz (60.1 kg)   SpO2 98%   BMI 24.25 kg/m  Wt Readings from Last 3 Encounters:  07/01/20 132 lb 9.6 oz (60.1 kg)  06/23/20 132 lb 6.4 oz (60.1 kg)  06/02/20 133 lb (60.3 kg)   Constitutional: normal weight, in NAD Eyes: PERRLA, EOMI, no exophthalmos ENT: moist mucous membranes, no thyromegaly, no cervical lymphadenopathy Cardiovascular: RRR, No MRG Respiratory: CTA  B Gastrointestinal: abdomen soft, NT, ND, BS+ Musculoskeletal: no deformities, strength intact in all 4 Skin: moist, warm, no rashes Neurological: no tremor with outstretched hands, DTR normal in all 4  ASSESSMENT: 1. DM2, non-insulin-dependent, uncontrolled, without long term complications, but with hyperglycemia  2. Microalbuminuria  3. HL  PLAN:  1. Patient with longstanding, uncontrolled, type 2 diabetes, with improved control on Metformin ER and weekly GLP-1 receptor agonist, but she was off her sulfonylurea at last visit and sugars are mostly above goal especially in the morning.  We restarted glipizide XL before breakfast and I advised her to try to increase the Metformin dose.  HbA1c at last visit was higher, at 8.5%. -At today's visit, we could not review blood sugars for the last month as she did not know how to use the Accu-Chek machine.  At today's visit, we demonstrated use.  She liked the One Touch better and tells me that she may return to use it even though she may need to buy the strips outside insurance. -The sugars that she checked before the last month were slightly better, but still above target in the morning.  She feels that her exercise + nutrition program that she follow-up for the last 3 months has helped.  At today's visit, indeed, HbA1c Is 6.6% (better and the best she had in a long time) -Upon questioning, she did not try to increase the Metformin to 2 tablets a day as she was afraid of side effects.  She also forgot the recommended dose of glipizide so she is now using only 2.5 mg rather than 5 mg daily.  However, since HbA1c improved significantly since last visit, we will continue the same regimen.  I strongly advised her to continue with the diet and exercise. - I suggested to:  Patient Instructions  Please continue: - Metformin ER 500 mg with dinner - Trulicity 3 mg weekly - Glipizide XL 2.5 mg before b'fast  Please return in 3-4 months with your sugar log.    - advised to check sugars at different times of the day - 1x a day, rotating check times - advised for yearly eye exams >> she  is UTD - return to clinic in 3-4 months  2. Microalbuminuria -Likely related to diabetes -ACR was high in 07/2019: 36.9, and also previously -Continues on Micardis 80 mg daily  3. HL -Reviewed lipid panel from 07/2019: Triglycerides high, LDL above goal:208/278/42/118 -Before last visit, she just started rosuvastatin 5 mg weekly -However, lipid panel was rechecked again and the LDL was actually higher: Lab Results  Component Value Date   CHOL 218 (H) 06/02/2020   HDL 42 06/02/2020   LDLCALC 135 (H) 06/02/2020   TRIG 226 (H) 06/02/2020   CHOLHDL 5.2 (H) 06/02/2020  -After the above results returned, she was switched to Lipitor 10 mg weekly by PCP-she was advised to increase it to 3 times a week  Philemon Kingdom, MD PhD [facility] Endocrinology

## 2020-07-22 ENCOUNTER — Other Ambulatory Visit: Payer: Self-pay | Admitting: Internal Medicine

## 2020-07-22 DIAGNOSIS — Z1231 Encounter for screening mammogram for malignant neoplasm of breast: Secondary | ICD-10-CM

## 2020-08-11 DIAGNOSIS — M79602 Pain in left arm: Secondary | ICD-10-CM | POA: Diagnosis not present

## 2020-08-11 DIAGNOSIS — E538 Deficiency of other specified B group vitamins: Secondary | ICD-10-CM | POA: Diagnosis not present

## 2020-08-11 DIAGNOSIS — E559 Vitamin D deficiency, unspecified: Secondary | ICD-10-CM | POA: Diagnosis not present

## 2020-08-11 DIAGNOSIS — Z1389 Encounter for screening for other disorder: Secondary | ICD-10-CM | POA: Diagnosis not present

## 2020-08-11 DIAGNOSIS — I10 Essential (primary) hypertension: Secondary | ICD-10-CM | POA: Diagnosis not present

## 2020-08-11 DIAGNOSIS — H9313 Tinnitus, bilateral: Secondary | ICD-10-CM | POA: Diagnosis not present

## 2020-08-11 DIAGNOSIS — E1169 Type 2 diabetes mellitus with other specified complication: Secondary | ICD-10-CM | POA: Diagnosis not present

## 2020-08-11 DIAGNOSIS — E119 Type 2 diabetes mellitus without complications: Secondary | ICD-10-CM | POA: Diagnosis not present

## 2020-08-11 DIAGNOSIS — Z0000 Encounter for general adult medical examination without abnormal findings: Secondary | ICD-10-CM | POA: Diagnosis not present

## 2020-08-11 DIAGNOSIS — E611 Iron deficiency: Secondary | ICD-10-CM | POA: Diagnosis not present

## 2020-08-11 DIAGNOSIS — Z7984 Long term (current) use of oral hypoglycemic drugs: Secondary | ICD-10-CM | POA: Diagnosis not present

## 2020-08-24 ENCOUNTER — Telehealth: Payer: Self-pay | Admitting: Internal Medicine

## 2020-08-24 NOTE — Telephone Encounter (Signed)
Please advise 

## 2020-08-24 NOTE — Telephone Encounter (Signed)
Pt has been feeling bad. Pt is reading positive from covid from two home test pt was wondering about a medication Paxlovid and if it would go okay with her diabetes medication.  Patient would like a call back as soon as possible from nurse  [phone]

## 2020-08-24 NOTE — Telephone Encounter (Signed)
Called and advised pt to follow up with PCP regarding Paxlovid.

## 2020-08-24 NOTE — Telephone Encounter (Signed)
I am sorry to hear that.  I am not aware about an interaction between this medication and her diabetic regimen.  Please direct her to PCP for further information about this medication.

## 2020-09-07 ENCOUNTER — Ambulatory Visit: Payer: Medicare PPO | Attending: Critical Care Medicine

## 2020-09-07 DIAGNOSIS — Z20822 Contact with and (suspected) exposure to covid-19: Secondary | ICD-10-CM

## 2020-09-08 LAB — NOVEL CORONAVIRUS, NAA: SARS-CoV-2, NAA: DETECTED — AB

## 2020-09-09 DIAGNOSIS — U071 COVID-19: Secondary | ICD-10-CM | POA: Diagnosis not present

## 2020-09-10 ENCOUNTER — Telehealth: Payer: Self-pay

## 2020-09-10 NOTE — Telephone Encounter (Signed)
Called to discuss with patient about COVID-19 symptoms and the use of one of the available treatments for those with mild to moderate Covid symptoms and at a high risk of hospitalization.  Pt appears to qualify for outpatient treatment due to co-morbid conditions and/or a member of an at-risk group in accordance with the FDA Emergency Use Authorization.    Symptom onset: 08/22/20 Vaccinated: Yes Booster? Yes Immunocompromised? No Qualifiers: DM,HTN NIH Criteria: Tier 1  Pt. Is symptom free.  Sandra [name]

## 2020-09-15 ENCOUNTER — Ambulatory Visit: Payer: Medicare PPO

## 2020-09-22 DIAGNOSIS — H401112 Primary open-angle glaucoma, right eye, moderate stage: Secondary | ICD-10-CM | POA: Diagnosis not present

## 2020-10-18 ENCOUNTER — Ambulatory Visit: Payer: Medicare PPO | Admitting: Cardiovascular Disease

## 2020-10-28 ENCOUNTER — Other Ambulatory Visit: Payer: Self-pay

## 2020-10-28 ENCOUNTER — Encounter: Payer: Self-pay | Admitting: Internal Medicine

## 2020-10-28 ENCOUNTER — Ambulatory Visit: Payer: Medicare PPO | Admitting: Internal Medicine

## 2020-10-28 VITALS — BP 152/86 | HR 90 | Ht 62.0 in | Wt 132.0 lb

## 2020-10-28 DIAGNOSIS — R809 Proteinuria, unspecified: Secondary | ICD-10-CM

## 2020-10-28 DIAGNOSIS — E1165 Type 2 diabetes mellitus with hyperglycemia: Secondary | ICD-10-CM

## 2020-10-28 DIAGNOSIS — E785 Hyperlipidemia, unspecified: Secondary | ICD-10-CM

## 2020-10-28 LAB — POCT GLYCOSYLATED HEMOGLOBIN (HGB A1C): Hemoglobin A1C: 7.1 % — AB (ref 4.0–5.6)

## 2020-10-28 MED ORDER — METFORMIN HCL ER 500 MG PO TB24: 500.0000 mg | ORAL_TABLET | Freq: Every day | ORAL | 3 refills | Status: DC

## 2020-10-28 NOTE — Patient Instructions (Addendum)
Please continue: - Metformin ER 500 mg with dinner - Trulicity 3 mg weekly - Glipizide XL 2.5 mg before b'fast  Please return in 3-4 months with your sugar log.

## 2020-10-28 NOTE — Progress Notes (Signed)
Patient ID: [name], female   DOB: 1946-02-13, 75 y.o.   MRN: [id number]   This visit occurred during the SARS-CoV-2 public health emergency.  Safety protocols were in place, including screening questions prior to the visit, additional usage of staff PPE, and extensive cleaning of exam room while observing appropriate contact time as indicated for disinfecting solutions.   HPI: Sandra [name] is a 75 y.o.-year-old female, returning for f/u for DM2, dx in 2015, non-insulin-dependent, uncontrolled, with long term complications (MAU). Last visit 4 months ago.  Interim history: At last visit, she just finished a 12 weeks exercise and nutritional pgm. (with cardiology).  Blood pressure and blood sugars improved.  However, afterwards, she had Covid19 in 08/2020. She was on Prednisone >> sugars increased.  She plans to go back [employer] exercise program. In 01/2021 she will travel to [facility] to see her daughter.  Reviewed HbA1c levels: Lab Results  Component Value Date   HGBA1C 6.6 (A) 07/01/2020   HGBA1C 8.5 (A) 03/09/2020   HGBA1C 7.4 (A) 11/04/2019   HGBA1C 8.1 (A) 07/17/2019   HGBA1C 7.0 (A) 03/13/2019   HGBA1C 6.8 (A) 11/11/2018   HGBA1C 7.6 (A) 07/01/2018   HGBA1C 8.2 (A) 02/25/2018   HGBA1C 9.2 (A) 10/12/2017   HGBA1C 7.4 06/13/2017   HGBA1C 7.4 03/13/2017   HGBA1C 7.5 12/11/2016   HGBA1C 7.7 07/10/2016   HGBA1C 7.3 04/10/2016   HGBA1C 7.7 12/15/2015  07/30/2019: HbA1c 8.3% 10/2015: HbA1c 8.2%  Pt is on a regimen of: - Metformin ER 500 mg at bedtime >> stopped 2/2 recall >> restarted 500 mg at bedtime-she could not tolerate higher doses due to diarrhea - Glipizide XL 2.5 mg daily - Trulicity 1.5 mg >> 3 mg weekly-no GI side effects She was on Jardiance 10 mg daily in am - started 07/2016 >> could not tolerate it b/c increased urination and hyperglycemia  She was on Metformin 500 mg 1x a day with dinner (500 mg tid >> AP/diarrhea)  >> stopped when started Januvia  Pt  checks her sugars once a day: - am:  110-178 (ave 150) >> 148-220 >> 130-199 >> 137-154, 180 - 2h after b'fast:  170s >> 135-161, 258 >> n/c - before lunch:  125 >> 150s >> 160-180 >> n/c - 2h after lunch:  142 >> n/c >> 125-158 >> 200 >> n/c - before dinner: 128-165 >> n/c >> 196 >> n/c - 2h after dinner: n/c >> 137 >> n/c - bedtime: 160-180 >> 125-130 >> 163, 164 >> 150s-160s >> 150s - nighttime: n/c >> 169-208 >> n/c It is unclear at which CBG level she has hypoglycemia awareness. Highest CBG: 201 x1 > 220 >> 199 >> 180.  Glucometer: OneTouch >> AccuChek  Her GFR is not decreased, but she has microalbuminuria: 08/11/2020: 17/0.88, ACR 94.6 Lab Results  Component Value Date   BUN 20 06/02/2020   BUN 21 03/24/2020   CREATININE 0.95 06/02/2020   CREATININE 1.03 (H) 03/24/2020   Lab Results  Component Value Date   MICRALBCREAT 5.6 11/11/2018  07/30/2019: ACR 36.9, BUN/creatinine 19/0.86 07/09/2018: glucose of 173.  BUN/creatinine 13/0.75, GFR 92, ACR 96.3 07/04/2017: 21/0.73, ACR not checked 01/12/2017: Glu 181, 18/0.8, eGFR 86, ACR 42.1 10/22/2015: 21/0.79, ACR 35.6 On Mycardis 80.  + HL; last set of lipids: 08/11/2020: 201/202/42/124 Lab Results  Component Value Date   CHOL 218 (H) 06/02/2020   HDL 42 06/02/2020   LDLCALC 135 (H) 06/02/2020   TRIG 226 (H) 06/02/2020  CHOLHDL 5.2 (H) 06/02/2020  07/30/2019: 208/278/42/118 07/09/2018: 195/250/40/106 07/04/2017: 207/381/40/91 01/12/2017: 199/380/41/82 10/22/2015: 226/201/48/138  Previously on rosuvastatin 5 mg weekly >> then Lipitor 10 mg weekly >>. >> skipping doses  - last eye exam was in 09/2020: No DR; has high intraocular pressure.  On eyedrops.  She sees Dr. Prudencio Burly.  She has a history of cataract surgery.  - no numbness and tingling in her feet.   TSH on 08/11/2020: 1.10  B12 on 08/11/2020: 261  ROS: Constitutional: no weight gain/no weight loss, no fatigue, no subjective hyperthermia, no subjective hypothermia Eyes: no  blurry vision, no xerophthalmia ENT: no sore throat, no nodules palpated in neck, no dysphagia, no odynophagia, no hoarseness Cardiovascular: no CP/no SOB/no palpitations/no leg swelling Respiratory: no cough/no SOB/no wheezing Gastrointestinal: no N/no V/no D/no C/no acid reflux Musculoskeletal: no muscle aches/no joint aches Skin: no rashes, no hair loss Neurological: no tremors/no numbness/no tingling/no dizziness  I reviewed pt's medications, allergies, PMH, social hx, family hx, and changes were documented in the history of present illness. Otherwise, unchanged from my initial visit note.  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   Cervical pain 12/15/2015   Diverticulitis of colon 12/15/2015   Acid reflux 12/15/2015   Essential (primary) hypertension 12/15/2015   Benign breast lumps 12/15/2015   Knee pain 11/08/2011   Social History   Social History   Marital status: Married    Spouse name: N/A   Number of children: 2   Occupational History   Retired    Social History Main Topics   Smoking status: Never Smoker   Smokeless tobacco: Never Used   Alcohol use Socially    Drug use: No    Current Outpatient Medications on File Prior to Visit  Medication Sig Dispense Refill   amLODipine (NORVASC) 10 MG tablet Take 1 tablet (10 mg total) by mouth daily. 90 tablet 3   atorvastatin (LIPITOR) 10 MG tablet Take 10 mg by mouth as directed. TAKE 1 TABLET ONCE A WEEK     Blood Glucose Monitoring Suppl (ONETOUCH VERIO) w/Device KIT Use to check blood sugar daily 1 kit 0   carvedilol (COREG) 12.5 MG tablet Take 1 tablet (12.5 mg total) by mouth 2 (two) times  daily. 90 tablet 3   Dulaglutide (TRULICITY) 3 JG/2.8ZM SOPN Inject 3 mg into the skin once a week. 6 mL 3   glipiZIDE (GLUCOTROL XL) 2.5 MG 24 hr tablet Take 2 tablets (5 mg total) by mouth daily with breakfast. 180 tablet 3   glucose blood (ONETOUCH VERIO) test strip CHECK BLOOD SUGAR IN THE MORNING PRIOR TO BREAKFAST 100 strip 4   Insulin Pen Needle 32G X 4 MM MISC Use to inject insulin weekly 100 each 5   Lancets (ONETOUCH DELICA PLUS [id number]) MISC USE TO CHECK BLOOD SUGAR TWO TIMES A DAY. 100 each 2   latanoprost (XALATAN) 0.005 % ophthalmic solution Place 1 drop into both eyes at bedtime.     metFORMIN (GLUCOPHAGE-XR) 500 MG 24 hr tablet Take 2 tablets (1,000 mg total) by mouth daily with supper. (Patient taking differently: Take 500 mg by mouth daily with supper.) 180 tablet 3   MICARDIS HCT 80-25  MG per tablet Take 1 tablet by mouth daily.     Multiple Vitamins-Minerals (WOMENS MULTIVITAMIN PO) Take by mouth daily.     omeprazole (PRILOSEC) 20 MG capsule Take 20 mg by mouth daily.     No current facility-administered medications on file prior to visit.    Allergies  Allergen Reactions   Metformin Hcl Er Other (See Comments)   Sulfa Antibiotics Rash   Family History  Problem Relation Age of Onset   Peripheral Artery Disease Sister    Stroke Brother    Diabetes Brother    Heart attack Brother    Lung cancer Sister    Stroke Brother    CAD Brother    Heart disease Sister     PE: BP (!) 152/86   Pulse 90   Ht _0  (1.575 m)   Wt 132 lb (59.9 kg)   SpO2 96%   BMI 24.14 kg/m  Wt Readings from Last 3 Encounters:  10/28/20 132 lb (59.9 kg)  07/01/20 132 lb 9.6 oz (60.1 kg)  06/23/20 132 lb 6.4 oz (60.1 kg)   Constitutional: normal weight, in NAD Eyes: PERRLA, EOMI, no exophthalmos ENT: moist mucous membranes, no thyromegaly, no cervical lymphadenopathy Cardiovascular: RRR, No MRG Respiratory: CTA B Gastrointestinal: abdomen soft, NT, ND, BS+ Musculoskeletal: no  deformities, strength intact in all 4 Skin: moist, warm, no rashes Neurological: no tremor with outstretched hands, DTR normal in all 4  ASSESSMENT: 1. DM2, non-insulin-dependent, uncontrolled, without long term complications, but with hyperglycemia  2. Microalbuminuria  3. HL  PLAN:  1. Patient with longstanding, uncontrolled, type 2 diabetes, with improved control at last visit after she completed a 12-week exercise and nutritional program at the [facility].  HbA1c at that time was 6.6% but she was not checking sugars as she did not know how to use the Accu-Chek machine.  She liked the One Touch meter better and she purchased it out-of-pocket since last visit. 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  She does plan to restart exercise. -Reviewing her blood sugars at home, they are not actually higher than last visit, but this is  per her recall as she did not bring her meter to download.  For now, especially since she is planning to restart exercise, we can continue the same regimen.  We again discussed about potentially increasing the metformin dose but she mentions she cannot tolerate higher doses due to diarrhea. - I suggested to:  Patient Instructions  Please continue: - Metformin ER 500 mg with dinner - Trulicity 3 mg weekly - Glipizide XL 2.5 mg before b'fast  Please return in 3-4 months with your sugar log.   - we checked her HbA1c: 7.1% (higher) - advised to check sugars at different times of the day - 1x a day, rotating check times - advised for yearly eye exams >> she is UTD - return to clinic in 3-4 months  2. Microalbuminuria -Likely related to diabetes -She continues on Micardis 80 mg daily  3. HL -reviewed latest lipid panel from 08/2020: 201/202/42/124: LDL and triglyceride fractions above target -She is supposed to be on Lipitor 10 mg 3 times a week, however at  this visit, she does me that she is not even taking it once a week.  I advised her to take it consistently and increase the dose as above.  Philemon Kingdom, MD PhD [facility]  Endocrinology

## 2020-10-28 NOTE — Addendum Note (Signed)
Addended by: Shelly Bombard on: 10/28/2020 11:04 AM   Modules accepted: Orders

## 2020-11-05 ENCOUNTER — Other Ambulatory Visit: Payer: Self-pay

## 2020-11-05 ENCOUNTER — Ambulatory Visit
Admission: RE | Admit: 2020-11-05 | Discharge: 2020-11-05 | Disposition: A | Discharge status: Home / Self Care | Payer: Medicare PPO | Source: Ambulatory Visit | Attending: Internal Medicine | Admitting: Internal Medicine

## 2020-11-05 DIAGNOSIS — Z1231 Encounter for screening mammogram for malignant neoplasm of breast: Secondary | ICD-10-CM

## 2020-11-17 NOTE — Progress Notes (Signed)
Office Visit    Patient Name: Sandra [name] Date of Encounter: 11/18/2020  PCP:  Josetta Huddle, MD   [place]  Cardiologist:  Skeet Latch, MD  Advanced Practice Provider:  No care team member to display Electrophysiologist:  None    Chief Complaint    Sandra [name] is a 75 y.o. female with a hx of diabetes, hypertension, mild carotid stenosis, HLD presents today for hypertension follow up   Past Medical History    Past Medical History:  Diagnosis Date   Pure hypercholesterolemia 01/22/2020   No past surgical history on file.  Allergies  Allergies  Allergen Reactions   Metformin Hcl Er Other (See Comments)   Sulfa Antibiotics Rash    History of Present Illness    Sandra [name] is a 75 y.o. female with a hx of diabetes, hypertension, mild carotid stenosis, HLD last seen 05/2020.  She initially established 01/2020 with the advanced hypertension clinic. Her blood pressure was 150s/90s with associated tinnitus. Amlodipine was increased at that visit and Metoprolol transitioned to Carvedilol. She was enrolled in the PREP program through the [facility]. When last seen 05/2020 and doing well from a cardiac perspective.   She presents today for follow up. Doing overall well from cardiac perspective.  She did have COVID 4 months ago but feels her breathing is back to normal.  She has not resumed her exercise routine since having COVID but is hopeful to start. Plans to go to [facility]. She very much enjoyed participating in their PREP program. Reports no shortness of breath nor dyspnea on exertion. Reports no chest pain, pressure, or tightness. No edema, orthopnea, PND. Reports no palpitations.  Monitoring BP intermittently at home with readings <130/80  EKGs/Labs/Other Studies Reviewed:   The following studies were reviewed today:   EKG:  EKG is ordered today.  The ekg ordered today demonstrates NSR 75 bpm with no acute ST/T wave  changes  Recent Labs: 06/02/2020: ALT 15; BUN 20; Creatinine, Ser 0.95; Potassium 4.2; Sodium 140  Recent Lipid Panel    Component Value Date/Time   CHOL 218 (H) 06/02/2020 0917   TRIG 226 (H) 06/02/2020 0917   HDL 42 06/02/2020 0917   CHOLHDL 5.2 (H) 06/02/2020 0917   LDLCALC 135 (H) 06/02/2020 0917   Home Medications   Current Meds  Medication Sig   amLODipine (NORVASC) 10 MG tablet Take 1 tablet (10 mg total) by mouth daily.   Blood Glucose Monitoring Suppl (ONETOUCH VERIO) w/Device KIT Use to check blood sugar daily   carvedilol (COREG) 12.5 MG tablet Take 1 tablet (12.5 mg total) by mouth 2 (two) times daily.   Dulaglutide (TRULICITY) 3 OJ/5.0KX SOPN Inject 3 mg into the skin once a week.   glipiZIDE (GLUCOTROL XL) 2.5 MG 24 hr tablet Take 2 tablets (5 mg total) by mouth daily with breakfast.   glucose blood (ONETOUCH VERIO) test strip CHECK BLOOD SUGAR IN THE MORNING PRIOR TO BREAKFAST   Insulin Pen Needle 32G X 4 MM MISC Use to inject insulin weekly   Lancets (ONETOUCH DELICA PLUS [id number]) MISC USE TO CHECK BLOOD SUGAR TWO TIMES A DAY.   latanoprost (XALATAN) 0.005 % ophthalmic solution Place 1 drop into both eyes at bedtime.   metFORMIN (GLUCOPHAGE-XR) 500 MG 24 hr tablet Take 1 tablet (500 mg total) by mouth daily with supper.   MICARDIS HCT 80-25 MG per tablet Take 1 tablet by mouth daily.   Multiple Vitamins-Minerals (WOMENS MULTIVITAMIN PO)  Take by mouth daily.   omeprazole (PRILOSEC) 20 MG capsule Take 20 mg by mouth daily.   [DISCONTINUED] atorvastatin (LIPITOR) 10 MG tablet Take 10 mg by mouth as directed. TAKE 1 TABLET ONCE A WEEK     Review of Systems      All other systems reviewed and are otherwise negative except as noted above.  Physical Exam    VS:  BP 140/78 (BP Location: Left Arm, Patient Position: Sitting, Cuff Size: Normal)   Pulse 75   Ht '5\' 2"'  (1.575 m)   Wt 135 lb (61.2 kg)   BMI 24.69 kg/m  , BMI Body mass index is 24.69 kg/m.  Wt Readings  from Last 3 Encounters:  11/18/20 135 lb (61.2 kg)  10/28/20 132 lb (59.9 kg)  07/01/20 132 lb 9.6 oz (60.1 kg)     GEN: Well nourished, well developed, in no acute distress. HEENT: normal. Neck: Supple, no JVD, carotid bruits, or masses. Cardiac: RRR, no murmurs, rubs, or gallops. No clubbing, cyanosis, edema.  Radials/PT 2+ and equal bilaterally.  Respiratory:  Respirations regular and unlabored, clear to auscultation bilaterally. GI: Soft, nontender, nondistended. MS: No deformity or atrophy. Skin: Warm and dry, no rash. Neuro:  Strength and sensation are intact. Psych: Normal affect.  Assessment & Plan    HTN - BP well controlled by home monitoring. Continue current antihypertensive regimen.    Carotid stenosis - 01/2018 bilateral 1-39% stenosis. No amaurosis fugax. Continue optimal cholesterol control. No indication for repeat duplex at this time.  HLD - 06/02/20 total cholesteorl 218, HDL 42, LDL 135, triglycerides 226 in setting of not taking medication regularly. 08/10/20 LDL 124. She is planning to start taking her Atorvastatin more consistently. We discussed putting it next to her Trulicity which she also takes once per week. If LDL not at goal 2 months after taking consistently, consider increased frequency.  Disposition: Follow up in 6 month(s) with Dr. Oval Linsey or APP.  Signed, Loel Dubonnet, NP 11/18/2020, 9:40 AM [place]

## 2020-11-18 ENCOUNTER — Encounter (HOSPITAL_BASED_OUTPATIENT_CLINIC_OR_DEPARTMENT_OTHER): Payer: Self-pay | Admitting: Family

## 2020-11-18 ENCOUNTER — Other Ambulatory Visit: Payer: Self-pay

## 2020-11-18 ENCOUNTER — Ambulatory Visit (HOSPITAL_BASED_OUTPATIENT_CLINIC_OR_DEPARTMENT_OTHER): Payer: Medicare PPO | Admitting: Family

## 2020-11-18 VITALS — BP 140/78 | HR 75 | Ht 62.0 in | Wt 135.0 lb

## 2020-11-18 DIAGNOSIS — I10 Essential (primary) hypertension: Secondary | ICD-10-CM | POA: Diagnosis not present

## 2020-11-18 DIAGNOSIS — E782 Mixed hyperlipidemia: Secondary | ICD-10-CM | POA: Diagnosis not present

## 2020-11-18 MED ORDER — ATORVASTATIN CALCIUM 10 MG PO TABS: 10.0000 mg | ORAL_TABLET | ORAL | 1 refills | Status: DC

## 2020-11-18 NOTE — Patient Instructions (Addendum)
Medication Instructions:  Continue your current medications.   We have sent a refill of your Atorvastatin to the pharmacy.   *If you need a refill on your cardiac medications before your next appointment, please call your pharmacy*   Lab Work: None ordered   Testing/Procedures: Your EKG today shows normal sinus rhythm which is a good result!  Follow-Up: At [facility], you and your health needs are our priority.  As part of our continuing mission to provide you with exceptional heart care, we have created designated Provider Care Teams.  These Care Teams include your primary Cardiologist (physician) and Advanced Practice Providers (APPs -  Physician Assistants and Nurse Practitioners) who all work together to provide you with the care you need, when you need it.  We recommend signing up for the patient portal called "MyChart".  Sign up information is provided on this After Visit Summary.  MyChart is used to connect with patients for Virtual Visits (Telemedicine).  Patients are able to view lab/test results, encounter notes, upcoming appointments, etc.  Non-urgent messages can be sent to your provider as well.   To learn more about what you can do with MyChart, go to [email or web address].    Your next appointment:   6 month(s)  The format for your next appointment:   In Person  Provider:   Chilton Si, MD   Other Instructions  Heart Healthy Diet Recommendations: A low-salt diet is recommended. Meats should be grilled, baked, or boiled. Avoid fried foods. Focus on lean protein sources like fish or chicken with vegetables and fruits. The American Heart Association is a Chief Technology Officer!  American Heart Association Diet and Lifeystyle Recommendations   Exercise recommendations: The American Heart Association recommends 150 minutes of moderate intensity exercise weekly. Try 30 minutes of moderate intensity exercise 4-5 times per week. This could include walking, jogging, or  swimming.  Tips to Measure your Blood Pressure Correctly  Here's what you can do to ensure a correct reading:  Don't drink a caffeinated beverage or smoke during the 30 minutes before the test.  Sit quietly for five minutes before the test begins.  During the measurement, sit in a chair with your feet on the floor and your arm supported so your elbow is at about heart level.  The inflatable part of the cuff should completely cover at least 80% of your upper arm, and the cuff should be placed on bare skin, not over a shirt.  Don't talk during the measurement.  Have your blood pressure measured twice, with a brief break in between. If the readings are different by 5 points or more, have it done a third time.   Blood pressure categories  Blood pressure category SYSTOLIC (upper number)  DIASTOLIC (lower number)  Normal Less than 120 mm Hg and Less than 80 mm Hg  Elevated 120-129 mm Hg and Less than 80 mm Hg  High blood pressure: Stage 1 hypertension 130-139 mm Hg or 80-89 mm Hg  High blood pressure: Stage 2 hypertension 140 mm Hg or higher or 90 mm Hg or higher  Hypertensive crisis (consult your doctor immediately) Higher than 180 mm Hg and/or Higher than 120 mm Hg  Source: American Heart Association and American Stroke Association. For more on getting your blood pressure under control, buy Controlling Your Blood Pressure, a Special Health Report from [facility].   Blood Pressure Log   Date   Time  Blood Pressure  Position  Example: Nov 1 9 AM 124/78 sitting

## 2020-11-25 ENCOUNTER — Other Ambulatory Visit: Payer: Self-pay

## 2020-11-25 ENCOUNTER — Other Ambulatory Visit (HOSPITAL_BASED_OUTPATIENT_CLINIC_OR_DEPARTMENT_OTHER): Payer: Self-pay

## 2020-11-25 ENCOUNTER — Ambulatory Visit: Payer: Medicare PPO | Attending: Internal Medicine

## 2020-11-25 DIAGNOSIS — Z23 Encounter for immunization: Secondary | ICD-10-CM

## 2020-11-25 MED ORDER — COVID-19 MRNA VACC (MODERNA) 100 MCG/0.5ML IM SUSP
INTRAMUSCULAR | 0 refills | Status: AC
  Filled 2020-11-25: qty 0.25, 1d supply, fill #0

## 2020-11-25 NOTE — Progress Notes (Signed)
   Covid-19 Vaccination Clinic  Name:  BETHANNY TOELLE    MRN: [id number] DOB: Nov 19, 1945  11/25/2020  Ms. Viele was observed post Covid-19 immunization for 15 minutes without incident. She was provided with Vaccine Information Sheet and instruction to access the V-Safe system.   Ms. Filippone was instructed to call 911 with any severe reactions post vaccine: Difficulty breathing  Swelling of face and throat  A fast heartbeat  A bad rash all over body  Dizziness and weakness   Immunizations Administered     Name Date Dose VIS Date Route   Moderna Covid-19 Booster Vaccine 11/25/2020 11:40 AM 0.25 mL 02/25/2020 Intramuscular   Manufacturer: Moderna   Lot: [id number]   NDC: 09381-829-93

## 2020-12-04 ENCOUNTER — Other Ambulatory Visit: Payer: Self-pay | Admitting: Internal Medicine

## 2021-01-13 ENCOUNTER — Other Ambulatory Visit: Payer: Self-pay | Admitting: Cardiovascular Disease

## 2021-01-13 NOTE — Telephone Encounter (Signed)
Rx(s) sent to pharmacy electronically.  

## 2021-01-16 ENCOUNTER — Other Ambulatory Visit: Payer: Self-pay | Admitting: Cardiovascular Disease

## 2021-02-03 ENCOUNTER — Ambulatory Visit (HOSPITAL_BASED_OUTPATIENT_CLINIC_OR_DEPARTMENT_OTHER): Payer: Medicare PPO | Admitting: Cardiovascular Disease

## 2021-02-10 DIAGNOSIS — E538 Deficiency of other specified B group vitamins: Secondary | ICD-10-CM | POA: Diagnosis not present

## 2021-02-10 DIAGNOSIS — M79602 Pain in left arm: Secondary | ICD-10-CM | POA: Diagnosis not present

## 2021-02-10 DIAGNOSIS — Z23 Encounter for immunization: Secondary | ICD-10-CM | POA: Diagnosis not present

## 2021-02-10 DIAGNOSIS — E611 Iron deficiency: Secondary | ICD-10-CM | POA: Diagnosis not present

## 2021-02-10 DIAGNOSIS — N8110 Cystocele, unspecified: Secondary | ICD-10-CM | POA: Diagnosis not present

## 2021-02-10 DIAGNOSIS — E119 Type 2 diabetes mellitus without complications: Secondary | ICD-10-CM | POA: Diagnosis not present

## 2021-02-10 DIAGNOSIS — H9313 Tinnitus, bilateral: Secondary | ICD-10-CM | POA: Diagnosis not present

## 2021-02-10 DIAGNOSIS — R0789 Other chest pain: Secondary | ICD-10-CM | POA: Diagnosis not present

## 2021-02-10 DIAGNOSIS — I10 Essential (primary) hypertension: Secondary | ICD-10-CM | POA: Diagnosis not present

## 2021-03-03 ENCOUNTER — Ambulatory Visit: Payer: Medicare PPO | Admitting: Internal Medicine

## 2021-03-03 ENCOUNTER — Other Ambulatory Visit: Payer: Self-pay

## 2021-03-03 ENCOUNTER — Encounter: Payer: Self-pay | Admitting: Internal Medicine

## 2021-03-03 VITALS — BP 150/100 | HR 90 | Ht 62.0 in | Wt 127.8 lb

## 2021-03-03 DIAGNOSIS — E1165 Type 2 diabetes mellitus with hyperglycemia: Secondary | ICD-10-CM | POA: Diagnosis not present

## 2021-03-03 DIAGNOSIS — R809 Proteinuria, unspecified: Secondary | ICD-10-CM

## 2021-03-03 DIAGNOSIS — E785 Hyperlipidemia, unspecified: Secondary | ICD-10-CM | POA: Diagnosis not present

## 2021-03-03 LAB — POCT GLYCOSYLATED HEMOGLOBIN (HGB A1C): Hemoglobin A1C: 6.9 % — AB (ref 4.0–5.6)

## 2021-03-03 MED ORDER — GLIPIZIDE ER 2.5 MG PO TB24: ORAL_TABLET | ORAL | 3 refills | Status: DC

## 2021-03-03 MED ORDER — METFORMIN HCL ER 500 MG PO TB24: 500.0000 mg | ORAL_TABLET | Freq: Every day | ORAL | 3 refills | Status: DC

## 2021-03-03 MED ORDER — TRULICITY 3 MG/0.5ML ~~LOC~~ SOAJ: 3.0000 mg | SUBCUTANEOUS | 3 refills | Status: DC

## 2021-03-03 NOTE — Progress Notes (Signed)
Patient ID: Sandra [name], female   DOB: 17-Sep-1945, 75 y.o.   MRN: [id number]   This visit occurred during the SARS-CoV-2 public health emergency.  Safety protocols were in place, including screening questions prior to the visit, additional usage of staff PPE, and extensive cleaning of exam room while observing appropriate contact time as indicated for disinfecting solutions.   HPI: Sandra [name] is a 75 y.o.-year-old female, returning for f/u for DM2, dx in 2015, non-insulin-dependent, uncontrolled, with long term complications ([facility]). Last visit 4 months ago.  Interim history: No increased urination, blurry vision, nausea, chest pain. She is seen in the Hypertension clinic. Sees Dr. Oval Linsey. She was very stressed taking care of her sister in law and mother in law  -skipped meals and was forgetting Metformin. They both died recently.  Reviewed HbA1c levels: Lab Results  Component Value Date   HGBA1C 7.1 (A) 10/28/2020   HGBA1C 6.6 (A) 07/01/2020   HGBA1C 8.5 (A) 03/09/2020   HGBA1C 7.4 (A) 11/04/2019   HGBA1C 8.1 (A) 07/17/2019   HGBA1C 7.0 (A) 03/13/2019   HGBA1C 6.8 (A) 11/11/2018   HGBA1C 7.6 (A) 07/01/2018   HGBA1C 8.2 (A) 02/25/2018   HGBA1C 9.2 (A) 10/12/2017   HGBA1C 7.4 06/13/2017   HGBA1C 7.4 03/13/2017   HGBA1C 7.5 12/11/2016   HGBA1C 7.7 07/10/2016   HGBA1C 7.3 04/10/2016   HGBA1C 7.7 12/15/2015  07/30/2019: HbA1c 8.3% 10/2015: HbA1c 8.2%  Pt is on a regimen of: - Metformin ER 500 mg at bedtime >> stopped 2/2 recall >> restarted 500 mg at bedtime-she could not tolerate higher doses due to diarrhea - Glipizide XL 2.5 mg daily - Trulicity 1.5 mg >> 3 mg weekly-no GI side effects She was on Jardiance 10 mg daily in am - started 07/2016 >> could not tolerate it b/c increased urination and hyperglycemia  She was on Metformin 500 mg 1x a day with dinner (500 mg tid >> AP/diarrhea)  >> stopped when started Januvia  Pt checks her sugars once a day: - am:    148-220 >> 130-199 >> 137-154, 180 >> 115-140s - 2h after b'fast:  170s >> 135-161, 258 >> n/c - before lunch:  125 >> 150s >> 160-180 >> n/c - 2h after lunch:  142 >> n/c >> 125-158 >> 200 >> n/c - before dinner: 128-165 >> n/c >> 196 >> n/c - 2h after dinner: n/c >> 137 >> n/c - bedtime: 163, 164 >> 150s-160s >> 150s >> 140s-150s - nighttime: n/c >> 169-208 >> n/c It is unclear at which CBG level she has hypoglycemia awareness. Lowest: 110 Highest:  199 >> 180 >> 215 x1.  Glucometer: OneTouch >> AccuChek  Her GFR is not decreased, but she has microalbuminuria: 08/11/2020: 17/0.88, ACR 94.6 Lab Results  Component Value Date   BUN 20 06/02/2020   BUN 21 03/24/2020   CREATININE 0.95 06/02/2020   CREATININE 1.03 (H) 03/24/2020   Lab Results  Component Value Date   MICRALBCREAT 5.6 11/11/2018  07/30/2019: ACR 36.9, BUN/creatinine 19/0.86 07/09/2018: glucose of 173.  BUN/creatinine 13/0.75, GFR 92, ACR 96.3 07/04/2017: 21/0.73, ACR not checked 01/12/2017: Glu 181, 18/0.8, eGFR 86, ACR 42.1 10/22/2015: 21/0.79, ACR 35.6 On Micardis 80.  + HL; last set of lipids: 08/11/2020: 201/202/42/124 Lab Results  Component Value Date   CHOL 218 (H) 06/02/2020   HDL 42 06/02/2020   LDLCALC 135 (H) 06/02/2020   TRIG 226 (H) 06/02/2020   CHOLHDL 5.2 (H) 06/02/2020  07/30/2019: 208/278/42/118 07/09/2018: 195/250/40/106 07/04/2017:  207/381/40/91 01/12/2017: 199/380/41/82 10/22/2015: 226/201/48/138  Previously on rosuvastatin 5 mg weekly >> then Lipitor 10 mg weekly >> advised her to take it 3 times a week at least.  - last eye exam was in 09/2020: No DR; has high intraocular pressure.  On eyedrops.  She sees Dr. Prudencio Burly.  She has a history of cataract surgery.  - no numbness and tingling in her feet.   TSH on 08/11/2020: 1.10  B12 on 08/11/2020: 261  ROS: + see HPI  I reviewed pt's medications, allergies, PMH, social hx, family hx, and changes were documented in the history of present illness.  Otherwise, unchanged from my initial visit note.  Past medical history: Patient Active Problem List   Diagnosis Date Noted   Microalbuminuria 12/11/2016    Priority: 1.   Type 2 diabetes mellitus with hyperglycemia ([place]) 12/15/2015    Priority: 1.   Dyslipidemia 02/25/2018    Priority: 2.   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   Cervical pain 12/15/2015   Diverticulitis of colon 12/15/2015   Acid reflux 12/15/2015   Essential (primary) hypertension 12/15/2015   Benign breast lumps 12/15/2015   Knee pain 11/08/2011   Social History   Social History   Marital status: Married    Spouse name: N/A   Number of children: 2   Occupational History   Retired    Social History Main Topics   Smoking status: Never Smoker   Smokeless tobacco: Never Used   Alcohol use Socially    Drug use: No    Current Outpatient Medications on File Prior to Visit  Medication Sig Dispense Refill   amLODipine (NORVASC) 10 MG tablet TAKE 1 TABLET BY MOUTH EVERY DAY 90 tablet 3   atorvastatin (LIPITOR) 10 MG tablet Take 1 tablet (10 mg total) by mouth as directed. TAKE 1 TABLET ONCE A WEEK 12 tablet 1   Blood Glucose Monitoring Suppl (ONETOUCH VERIO) w/Device KIT Use to check blood sugar daily 1 kit 0   carvedilol (COREG) 12.5 MG tablet TAKE 1 TABLET BY MOUTH 2 TIMES DAILY. 180 tablet 1   COVID-19 mRNA vaccine, Moderna, 100 MCG/0.5ML injection Inject into the muscle. 0.25 mL 0   Dulaglutide (TRULICITY) 3 GH/8.2XH SOPN Inject 3 mg into the skin once a week. 6 mL 3   glipiZIDE (GLUCOTROL XL) 2.5 MG 24 hr tablet TAKE 1 TABLET (2.5 MG TOTAL) BY MOUTH 2 (TWO) TIMES DAILY BEFORE A MEAL. 180 tablet 3   glucose blood (ONETOUCH VERIO) test strip CHECK BLOOD SUGAR IN THE MORNING PRIOR TO BREAKFAST 100 strip 4   Insulin Pen Needle 32G X 4 MM MISC Use to inject insulin weekly 100 each 5   Lancets (ONETOUCH DELICA PLUS [id number]) MISC USE  TO CHECK BLOOD SUGAR TWO TIMES A DAY. 100 each 2   latanoprost (XALATAN) 0.005 % ophthalmic solution Place 1 drop into both eyes at bedtime.     metFORMIN (GLUCOPHAGE-XR) 500 MG 24 hr tablet Take 1 tablet (500 mg total) by mouth daily with supper. 90 tablet 3   MICARDIS HCT 80-25 MG per tablet Take 1 tablet by mouth daily.     Multiple Vitamins-Minerals (WOMENS MULTIVITAMIN PO) Take by mouth daily.     omeprazole (PRILOSEC) 20 MG capsule Take 20 mg by mouth daily.     No current facility-administered medications on file prior to visit.    Allergies  Allergen Reactions   Metformin Hcl Er Other (See Comments)  Sulfa Antibiotics Rash   Family History  Problem Relation Age of Onset   Peripheral Artery Disease Sister    Stroke Brother    Diabetes Brother    Heart attack Brother    Lung cancer Sister    Stroke Brother    CAD Brother    Heart disease Sister     PE: BP (!) 150/100 (BP Location: Right Arm, Patient Position: Sitting, Cuff Size: Normal)   Pulse 90   Ht '5\' 2"'  (1.575 m)   Wt 127 lb 12.8 oz (58 kg)   SpO2 99%   BMI 23.37 kg/m  Wt Readings from Last 3 Encounters:  03/03/21 127 lb 12.8 oz (58 kg)  11/18/20 135 lb (61.2 kg)  10/28/20 132 lb (59.9 kg)   Constitutional: normal weight, in NAD Eyes: PERRLA, EOMI, no exophthalmos ENT: moist mucous membranes, no thyromegaly, no cervical lymphadenopathy Cardiovascular: RRR, No MRG Respiratory: CTA B Gastrointestinal: abdomen soft, NT, ND, BS+ Musculoskeletal: no deformities, strength intact in all 4 Skin: moist, warm, no rashes Neurological: no tremor with outstretched hands, DTR normal in all 4  ASSESSMENT: 1. DM2, non-insulin-dependent, uncontrolled, without long term complications, but with hyperglycemia  2. Microalbuminuria  3. HL  PLAN:  1. Patient with longstanding, uncontrolled, type 2 diabetes, with improved control after she completed a 12-week exercise and nutritional program at the [facility].  HbA1c decreased  to 6.6% at that time.  Afterwards, sugars increased during COVID-19 infection but they started to stabilize at last visit.  She was planning to restart exercise so we did not change her regimen at that time.  She is on a low-dose of metformin due to diarrhea with higher doses.  At last visit, HbA1c was higher than before, at 7.1%. -At today's visit, sugars are mostly at goal throughout the day but she has occasional blood sugars in the 140s in the morning especially when getting metformin doses at night.  She had more stress recently with family members being sick and was not eating too well and she feels that this is why she was forgetting doses.  However, going forward, she is planning to restart taking metformin every day.  No changes are needed in the regimen now.  I refilled all of her diabetic medications. - I suggested to:  Patient Instructions  Please continue: - Metformin ER 500 mg with dinner - Trulicity 3 mg weekly - Glipizide XL 2.5 mg before b'fast  Please return in 4 months with your sugar log.   - we checked her HbA1c: 6.9% (lower) - advised to check sugars at different times of the day - 1x a day, rotating check times - advised for yearly eye exams >> she is UTD - lost 8 lbs since last OV  - return to clinic in 4 months  2. Microalbuminuria -Likely related to diabetes -She is on Micardis 80 mg daily  3. HL -Reviewed latest lipid panel from 08/2020: 201/202/42/124: LDL and triglycerides fractions above target -She was not taking Lipitor at last visit.  We discussed about the importance of taking it consistently, 10 mg 3 times a week.  Philemon Kingdom, MD PhD [facility] Endocrinology

## 2021-03-03 NOTE — Patient Instructions (Addendum)
Please continue: - Metformin ER 500 mg with dinner - Trulicity 3 mg weekly - Glipizide XL 2.5 mg before b'fast  Please return in 4 months with your sugar log.

## 2021-04-18 DIAGNOSIS — H401112 Primary open-angle glaucoma, right eye, moderate stage: Secondary | ICD-10-CM | POA: Diagnosis not present

## 2021-04-18 DIAGNOSIS — H35351 Cystoid macular degeneration, right eye: Secondary | ICD-10-CM | POA: Diagnosis not present

## 2021-04-19 DIAGNOSIS — Z03818 Encounter for observation for suspected exposure to other biological agents ruled out: Secondary | ICD-10-CM | POA: Diagnosis not present

## 2021-04-19 DIAGNOSIS — Z20822 Contact with and (suspected) exposure to covid-19: Secondary | ICD-10-CM | POA: Diagnosis not present

## 2021-04-20 ENCOUNTER — Telehealth (HOSPITAL_BASED_OUTPATIENT_CLINIC_OR_DEPARTMENT_OTHER): Payer: Self-pay | Admitting: Cardiovascular Disease

## 2021-04-20 NOTE — Telephone Encounter (Signed)
Pt c/o BP issue: STAT if pt c/o blurred vision, one-sided weakness or slurred speech  1. What are your last 5 BP readings? 190/85, 165/90, 200/90170/90 at this time it is 156/115 and  179/102-   2. Are you having any other symptoms (ex. Dizziness, headache, blurred vision, passed out)?  No symptoms  3. What is your BP issue? Blood pressure is running high- patient wants to know if she needs to be seen

## 2021-04-20 NOTE — Telephone Encounter (Signed)
Spoke to patient she stated her B/P has been elevated.Readings listed below.Stated she had a family crisis.Her sister has been diagnosed with cancer.Her other sister moved out of state.She has not been eating good, a lot of fast food.Also she has missed taking her B/P medications.Stated her B/P this morning 158/92.Pulse 70 to 80's.Advised to take medications everyday as prescribed.Advised to eat a better diet low in salt and fat.Monitor B/P daily and bring readings to appointment with Dr.[place] 05/06/21 at 8:00 am.Advised I will make Dr.[place] aware.

## 2021-04-26 ENCOUNTER — Telehealth: Payer: Self-pay | Admitting: Internal Medicine

## 2021-04-26 NOTE — Telephone Encounter (Signed)
MEDICATION: Dulaglutide (TRULICITY) 3 MG/0.5ML SOPN  PHARMACY:   [id number] - [place], [place] - [address] Phone:  [phone]  Fax:  [phone]      HAS THE PATIENT CONTACTED THEIR PHARMACY?  yes  IS THIS A 90 DAY SUPPLY : yes  IS PATIENT OUT OF MEDICATION: yes  IF NOT; HOW MUCH IS LEFT:   LAST APPOINTMENT DATE: @10 /27/2022  NEXT APPOINTMENT DATE:@2 /28/2023  DO WE HAVE YOUR PERMISSION TO LEAVE A DETAILED MESSAGE?:  OTHER COMMENTS: Need new prescription for 1.5 MG on the above medication with correct dosage instructions.   **Let patient know to contact pharmacy at the end of the day to make sure medication is ready. **  ** Please notify patient to allow 48-72 hours to process**  **Encourage patient to contact the pharmacy for refills or they can request refills through [facility]**

## 2021-04-27 MED ORDER — TRULICITY 1.5 MG/0.5ML ~~LOC~~ SOAJ: 3.0000 mg | SUBCUTANEOUS | 11 refills | Status: DC

## 2021-04-27 NOTE — Telephone Encounter (Signed)
Rx sent with updated instructions. ?

## 2021-04-27 NOTE — Telephone Encounter (Signed)
T, We can send a double amount of trulicity 1.5 - 4 mg, with 11 refills - and mention in the instructions to take 2 injections once a week. Ty! C

## 2021-04-27 NOTE — Telephone Encounter (Signed)
Pharmacy is still waiting for new prescription per the PT phone call. [facility] at [facility] s but has 1.5MG  available so NEW prescription is needed with dosage listed.

## 2021-05-06 ENCOUNTER — Encounter (HOSPITAL_BASED_OUTPATIENT_CLINIC_OR_DEPARTMENT_OTHER): Payer: Self-pay | Admitting: Cardiovascular Disease

## 2021-05-06 ENCOUNTER — Other Ambulatory Visit: Payer: Self-pay

## 2021-05-06 ENCOUNTER — Ambulatory Visit (HOSPITAL_BASED_OUTPATIENT_CLINIC_OR_DEPARTMENT_OTHER): Payer: Medicare PPO | Admitting: Cardiovascular Disease

## 2021-05-06 DIAGNOSIS — I10 Essential (primary) hypertension: Secondary | ICD-10-CM

## 2021-05-06 DIAGNOSIS — I6523 Occlusion and stenosis of bilateral carotid arteries: Secondary | ICD-10-CM | POA: Diagnosis not present

## 2021-05-06 DIAGNOSIS — E7800 Pure hypercholesterolemia, unspecified: Secondary | ICD-10-CM | POA: Diagnosis not present

## 2021-05-06 NOTE — Progress Notes (Signed)
Cardiology Follow Up:    Date:  05/06/2021   ID:  Sandra [name], DOB 1946/03/04, MRN [id number]  PCP:  Josetta Huddle, MD  Cardiologist:  Skeet Latch, MD  Nephrologist:  Referring MD: Josetta Huddle, MD   CC: Hypertension  History of Present Illness:    Sandra [name] is a 75 y.o. female with a hx of diabetes, hypertension, mild carotid stenosis, and hyperlipidemia here for follow-up.  She initially established care 01/2020 in the advanced hypertension clinic. She has been on her same BP medication for 25 years.  Her blood pressure was pretty stable.  However she saw Dr. Inda Merlin because she started experiencing tinnitus.  When he saw her her blood pressure was in the 150s over 90s.  She had a screening a couple weeks prior and her blood pressure was elevated then as well.  She was started on amlodipine 5 mg.  Her blood pressure remained elevated so a few days ago metoprolol 25 mg was added.  At her initial ADV HTN appointment metoprolol was discontinued and amlodipine was increased.  She was also enrolled in the PREP program through the [facility]. She noted improvement in her BP.  She followed up with Laurann Montana, NP on 11/2020 and was doing well.   Lately her BP has been well-controlled.  It has been generally 130-140/70-80.  It occasionally increases to the 140-160 with stress and salt.  She had several family members die recently.  She was power of attorney for them and it was very stressful.  She hasn't been exercising as much.  She has no exertional chest pain or shortness of breath.  She denies lower extremity edema, orthopnea, or PND.  She has had a lot of family members visiting and has not been able to exercise as much but looks forward to starting back.  She is excited to welcome a new grandbaby any day.  [place] going to [place] baby is [place] to help her daughter for a month.  Been very consistent with taking her blood pressure medicine but still struggles with remembering  to take the atorvastatin once a week.  Past Medical History:  Diagnosis Date   Pure hypercholesterolemia 01/22/2020    History reviewed. No pertinent surgical history.  Current Medications: Current Meds  Medication Sig   amLODipine (NORVASC) 10 MG tablet TAKE 1 TABLET BY MOUTH EVERY DAY   atorvastatin (LIPITOR) 10 MG tablet Take 1 tablet (10 mg total) by mouth as directed. TAKE 1 TABLET ONCE A WEEK   Blood Glucose Monitoring Suppl (ONETOUCH VERIO) w/Device KIT Use to check blood sugar daily   carvedilol (COREG) 12.5 MG tablet TAKE 1 TABLET BY MOUTH 2 TIMES DAILY.   COVID-19 mRNA vaccine, Moderna, 100 MCG/0.5ML injection Inject into the muscle.   Dulaglutide (TRULICITY) 1.5 VQ/0.0QQ SOPN Inject 3 mg into the skin once a week.   glipiZIDE (GLUCOTROL XL) 2.5 MG 24 hr tablet TAKE 1 TABLET (2.5 MG TOTAL) BY MOUTH 2 (TWO) TIMES DAILY BEFORE A MEAL.   glucose blood (ONETOUCH VERIO) test strip CHECK BLOOD SUGAR IN THE MORNING PRIOR TO BREAKFAST   Insulin Pen Needle 32G X 4 MM MISC Use to inject insulin weekly   Lancets (ONETOUCH DELICA PLUS [id number]) MISC USE TO CHECK BLOOD SUGAR TWO TIMES A DAY.   latanoprost (XALATAN) 0.005 % ophthalmic solution Place 1 drop into both eyes at bedtime.   MICARDIS HCT 80-25 MG per tablet Take 1 tablet by mouth daily.  Multiple Vitamins-Minerals (WOMENS MULTIVITAMIN PO) Take by mouth daily.   omeprazole (PRILOSEC) 20 MG capsule Take 20 mg by mouth as needed (HEARTBURN).     Allergies:   Metformin hcl er and Sulfa antibiotics   Social History   Socioeconomic History   Marital status: Married    Spouse name: Not on file   Number of children: Not on file   Years of education: Not on file   Highest education level: Not on file  Occupational History   Not on file  Tobacco Use   Smoking status: Never   Smokeless tobacco: Never  Substance and Sexual Activity   Alcohol use: Not on file   Drug use: Not on file   Sexual activity: Not on file  Other  Topics Concern   Not on file  Social History Narrative   Not on file   Social Determinants of Health   Financial Resource Strain: Not on file  Food Insecurity: Not on file  Transportation Needs: Not on file  Physical Activity: Not on file  Stress: Not on file  Social Connections: Not on file     Family History: The patient's family history includes CAD in her brother; Diabetes in her brother; Heart attack in her brother; Heart disease in her sister; Lung cancer in her sister; Peripheral Artery Disease in her sister; Stroke in her brother and brother.  ROS:   Please see the history of present illness.     All other systems reviewed and are negative.  EKGs/Labs/Other Studies Reviewed:    EKG:  EKG is not ordered today.  The ekg ordered 01/22/20 demonstrates sinus rhythm.  Rate 76 bpm.    Recent Labs: 06/02/2020: ALT 15; BUN 20; Creatinine, Ser 0.95; Potassium 4.2; Sodium 140   Recent Lipid Panel    Component Value Date/Time   CHOL 218 (H) 06/02/2020 0917   TRIG 226 (H) 06/02/2020 0917   HDL 42 06/02/2020 0917   CHOLHDL 5.2 (H) 06/02/2020 0917   LDLCALC 135 (H) 06/02/2020 0917    Physical Exam:    VS:  BP (!) 156/76 (BP Location: Left Arm)    Pulse 78    Ht _0  (1.575 m)    Wt 129 lb 6.4 oz (58.7 kg)    SpO2 98%    BMI 23.67 kg/m  , BMI Body mass index is 23.67 kg/m. GENERAL:  Well appearing HEENT: Pupils equal round and reactive, fundi not visualized, oral mucosa unremarkable NECK:  No jugular venous distention, waveform within normal limits, carotid upstroke brisk and symmetric, no bruits, no thyromegaly LUNGS:  Clear to auscultation bilaterally HEART:  RRR.  PMI not displaced or sustained,S1 and S2 within normal limits, no S3, no S4, no clicks, no rubs, no murmurs ABD:  Flat, positive bowel sounds normal in frequency in pitch, no bruits, no rebound, no guarding, no midline pulsatile mass, no hepatomegaly, no splenomegaly EXT:  2 plus pulses throughout, no edema, no  cyanosis no clubbing SKIN:  No rashes no nodules NEURO:  Cranial nerves II through XII grossly intact, motor grossly intact throughout PSYCH:  Cognitively intact, oriented to person place and time   ASSESSMENT:    1. Bilateral carotid artery stenosis   2. Pure hypercholesterolemia   3. Essential (primary) hypertension      PLAN:    Carotid stenosis Mild bilateral stenosis in 2019.  Repeat carotid Dopplers.  LDL goal is less than 70.  Pure hypercholesterolemia LDL goal is less than 70.  She tries  to take atorvastatin once per week but forgets.  She has not tolerated other statins or higher doses.  She will come back for fasting lipids and a CMP in February.  She knows that her diet has been off.  She is going to work on how to take it more regularly.  If her LDL remains greater than 70, we will have her see pharmacy to consider starting a PCSK9 inhibitor.  Essential (primary) hypertension Blood pressure remains above goal.  It has been much better controlled at home and she has been dealing with a lot of stressful situations lately.  She prefers to work on getting back into her diet and exercise routine.  Many of her stressors have now subsided.  She will continue with amlodipine, carvedilol, telmisartan, and HCTZ for now.  If her pressures remain elevated at follow-up, we will need to do a work-up of secondary causes.  Her blood pressure machine was checked today and was consistent with the numbers were getting in the office.  Disposition:    Follow up 06/2020    Medication Adjustments/Labs and Tests Ordered: Current medicines are reviewed at length with the patient today.  Concerns regarding medicines are outlined above.  Orders Placed This Encounter  Procedures   Lipid panel   Comprehensive metabolic panel   VAS US CAROTID   No orders of the defined types were placed in this encounter.    Signed, Skeet Latch, MD  05/06/2021 8:41 AM    [place]

## 2021-05-06 NOTE — Assessment & Plan Note (Signed)
Mild bilateral stenosis in 2019.  Repeat carotid Dopplers.  LDL goal is less than 70.

## 2021-05-06 NOTE — Patient Instructions (Signed)
Medication Instructions:  No Changes In Medications at this time.  *If you need a refill on your cardiac medications before your next appointment, please call your pharmacy*  Lab Work: Engineer, technical sales FOR LAB WORK IN [place] OF 2023. Your provider has recommended lab work. Please have this collected at [facility] at [place]. The lab is open 8:00 am - 4:30 pm. Please avoid 12:00p - 1:00p for lunch hour. You do not need an appointment. Please go to [address] [place], [place] [address]. This is in the Primary Care office on the 3rd floor, let them know you are there for blood work and they will direct you to the lab.  If you have labs (blood work) drawn today and your tests are completely normal, you will receive your results only by: MyChart Message (if you have MyChart) OR A paper copy in the mail If you have any lab test that is abnormal or we need to change your treatment, we will call you to review the results.  Testing/Procedures: Your physician has requested that you have a carotid duplex IN [place]. This test is an ultrasound of the carotid arteries in your neck. It looks at blood flow through these arteries that supply the brain with blood. Allow one hour for this exam. There are no restrictions or special instructions.  Follow-Up: At [facility], you and your health needs are our priority.  As part of our continuing mission to provide you with exceptional heart care, we have created designated Provider Care Teams.  These Care Teams include your primary Cardiologist (physician) and Advanced Practice Providers (APPs -  Physician Assistants and Nurse Practitioners) who all work together to provide you with the care you need, when you need it.  We recommend signing up for the patient portal called "MyChart".  Sign up information is provided on this After Visit Summary.  MyChart is used to connect with patients for Virtual Visits (Telemedicine).  Patients are  able to view lab/test results, encounter notes, upcoming appointments, etc.  Non-urgent messages can be sent to your provider as well.   To learn more about what you can do with MyChart, go to [email or web address].    Your next appointment:   IN FEB RIGHT AFTER TESTING   The format for your next appointment:   In Person  Provider:   Chilton Si, MD   Other Instructions  PLEASE RESTART EXERCISE 150 MINUTES WEEKLY

## 2021-05-06 NOTE — Assessment & Plan Note (Signed)
LDL goal is less than 70.  She tries to take atorvastatin once per week but forgets.  She has not tolerated other statins or higher doses.  She will come back for fasting lipids and a CMP in February.  She knows that her diet has been off.  She is going to work on how to take it more regularly.  If her LDL remains greater than 70, we will have her see pharmacy to consider starting a PCSK9 inhibitor.

## 2021-05-06 NOTE — Assessment & Plan Note (Signed)
Blood pressure remains above goal.  It has been much better controlled at home and she has been dealing with a lot of stressful situations lately.  She prefers to work on getting back into her diet and exercise routine.  Many of her stressors have now subsided.  She will continue with amlodipine, carvedilol, telmisartan, and HCTZ for now.  If her pressures remain elevated at follow-up, we will need to do a work-up of secondary causes.  Her blood pressure machine was checked today and was consistent with the numbers were getting in the office.

## 2021-06-13 DIAGNOSIS — H35351 Cystoid macular degeneration, right eye: Secondary | ICD-10-CM | POA: Diagnosis not present

## 2021-06-16 ENCOUNTER — Encounter (HOSPITAL_BASED_OUTPATIENT_CLINIC_OR_DEPARTMENT_OTHER): Payer: Medicare PPO

## 2021-06-17 DIAGNOSIS — E7800 Pure hypercholesterolemia, unspecified: Secondary | ICD-10-CM | POA: Diagnosis not present

## 2021-06-17 DIAGNOSIS — I6523 Occlusion and stenosis of bilateral carotid arteries: Secondary | ICD-10-CM | POA: Diagnosis not present

## 2021-06-18 LAB — COMPREHENSIVE METABOLIC PANEL
ALT: 17 IU/L (ref 0–32)
AST: 16 IU/L (ref 0–40)
Albumin/Globulin Ratio: 1.6 (ref 1.2–2.2)
Albumin: 4.4 g/dL (ref 3.7–4.7)
Alkaline Phosphatase: 110 IU/L (ref 44–121)
BUN/Creatinine Ratio: 18 (ref 12–28)
BUN: 18 mg/dL (ref 8–27)
Bilirubin Total: 0.3 mg/dL (ref 0.0–1.2)
CO2: 20 mmol/L (ref 20–29)
Calcium: 10.1 mg/dL (ref 8.7–10.3)
Chloride: 101 mmol/L (ref 96–106)
Creatinine, Ser: 1.01 mg/dL — ABNORMAL HIGH (ref 0.57–1.00)
Globulin, Total: 2.7 g/dL (ref 1.5–4.5)
Glucose: 183 mg/dL — ABNORMAL HIGH (ref 70–99)
Potassium: 4.1 mmol/L (ref 3.5–5.2)
Sodium: 140 mmol/L (ref 134–144)
Total Protein: 7.1 g/dL (ref 6.0–8.5)
eGFR: 59 mL/min/{1.73_m2} — ABNORMAL LOW (ref >59)

## 2021-06-18 LAB — LIPID PANEL
Chol/HDL Ratio: 3.9 ratio (ref 0.0–4.4)
Cholesterol, Total: 183 mg/dL (ref 100–199)
HDL: 47 mg/dL (ref >39)
LDL Chol Calc (NIH): 89 mg/dL (ref 0–99)
Triglycerides: 286 mg/dL — ABNORMAL HIGH (ref 0–149)
VLDL Cholesterol Cal: 47 mg/dL — ABNORMAL HIGH (ref 5–40)

## 2021-06-20 NOTE — Progress Notes (Signed)
Cardiology Follow Up:    Date:  06/23/2021   ID:  Sandra [name], DOB [birth year], MRN [id number]  PCP:  Josetta Huddle, MD  Cardiologist:  Skeet Latch, MD  Nephrologist:  Referring MD: Josetta Huddle, MD   CC: Hypertension  History of Present Illness:    Sandra [name] is a 76 y.o. female with a hx of diabetes, hypertension, mild carotid stenosis, and hyperlipidemia here for follow-up.  She initially established care 01/2020 in the advanced hypertension clinic. She has been on her same BP medication for 25 years.  Her blood pressure was pretty stable.  However she saw Dr. Inda Merlin because she started experiencing tinnitus.  When he saw her her blood pressure was in the 150s over 90s.  She had a screening a couple weeks prior and her blood pressure was elevated then as well.  She was started on amlodipine 5 mg.  Her blood pressure remained elevated so a few days ago metoprolol 25 mg was added.  At her initial ADV HTN appointment metoprolol was discontinued and amlodipine was increased.  She was also enrolled in the PREP program through the [facility]. She noted improvement in her BP.  She followed up with Sandra Montana, NP on 11/2020 and was doing well.   At her visit on 04/2021 her BP had been well-controlled. It was higher than usual because of family stressors. We discussed working on diet and exercise and a reassessment of her blood pressure remained elevated. She was referred to Pharmacy for PCSK9 inhibitor. She had carotid dopplers 06/2021 that showed mild bilateral stenosis. Today, she is feeling good overall aside from some indigestion/belching. She believes this is related to her Trulicity. When the indigestion occurs she needs to prop herself up. She is conscientious of her diet, but she does not eat much which is why she believes it is not related to food. Also, she states her discomfort seems to be different than her acid reflux because she remains compliant with omeprazole in the  mornings. At home her blood pressure was 136/80 this morning. Usually it ranges in the 130s-140s and is generally higher in clinic. Lately she has not been participating in formal exercise. Her most recent labs have shown improvement in her cholesterol levels. She denies any palpitations, chest pain, shortness of breath, or peripheral edema. No lightheadedness, headaches, syncope, orthopnea, or PND.   Past Medical History:  Diagnosis Date   Atypical chest pain 06/23/2021   Pure hypercholesterolemia 01/22/2020    No past surgical history on file.  Current Medications: Current Meds  Medication Sig   amLODipine (NORVASC) 10 MG tablet TAKE 1 TABLET BY MOUTH EVERY DAY   atorvastatin (LIPITOR) 10 MG tablet Take 1 tablet (10 mg total) by mouth as directed. TAKE 1 TABLET ONCE A WEEK   Blood Glucose Monitoring Suppl (ONETOUCH VERIO) w/Device KIT Use to check blood sugar daily   carvedilol (COREG) 12.5 MG tablet TAKE 1 TABLET BY MOUTH 2 TIMES DAILY.   COVID-19 mRNA vaccine, Moderna, 100 MCG/0.5ML injection Inject into the muscle.   Dulaglutide (TRULICITY) 1.5 RD/4.0CX SOPN Inject 3 mg into the skin once a week.   glipiZIDE (GLUCOTROL XL) 2.5 MG 24 hr tablet TAKE 1 TABLET (2.5 MG TOTAL) BY MOUTH 2 (TWO) TIMES DAILY BEFORE A MEAL.   glucose blood (ONETOUCH VERIO) test strip CHECK BLOOD SUGAR IN THE MORNING PRIOR TO BREAKFAST   Insulin Pen Needle 32G X 4 MM MISC Use to inject insulin weekly   ivabradine (CORLANOR)  5 MG TABS tablet TAKE 1 TABLET 2 HOURS PRIOR TO CT   Lancets (ONETOUCH DELICA PLUS [id number]) MISC USE TO CHECK BLOOD SUGAR TWO TIMES A DAY.   latanoprost (XALATAN) 0.005 % ophthalmic solution Place 1 drop into both eyes at bedtime.   metoprolol tartrate (LOPRESSOR) 100 MG tablet TAKE 1 TABLET 2 HOURS PRIOR TO CT   MICARDIS HCT 80-25 MG per tablet Take 1 tablet by mouth daily.   Multiple Vitamins-Minerals (WOMENS MULTIVITAMIN PO) Take by mouth daily.   omeprazole (PRILOSEC) 20 MG capsule Take  20 mg by mouth as needed (HEARTBURN).     Allergies:   Metformin hcl er and Sulfa antibiotics   Social History   Socioeconomic History   Marital status: Married    Spouse name: Not on file   Number of children: Not on file   Years of education: Not on file   Highest education level: Not on file  Occupational History   Not on file  Tobacco Use   Smoking status: Never   Smokeless tobacco: Never  Substance and Sexual Activity   Alcohol use: Not on file   Drug use: Not on file   Sexual activity: Not on file  Other Topics Concern   Not on file  Social History Narrative   Not on file   Social Determinants of Health   Financial Resource Strain: Not on file  Food Insecurity: Not on file  Transportation Needs: Not on file  Physical Activity: Not on file  Stress: Not on file  Social Connections: Not on file     Family History: The patient's family history includes CAD in her brother; Diabetes in her brother; Heart attack in her brother; Heart disease in her sister; Lung cancer in her sister; Peripheral Artery Disease in her sister; Stroke in her brother and brother.  ROS:   Please see the history of present illness.    (+) Indigestion (+) Belching (+) Acid reflux All other systems reviewed and are negative.  EKGs/Labs/Other Studies Reviewed:    Bilateral Carotid Dopplers 06/21/2021: Summary:  Right Carotid: Velocities in the right ICA are consistent with a 1-39%  stenosis.   Left Carotid: Velocities in the left ICA are consistent with a 1-39%  stenosis.   Vertebrals:  Bilateral vertebral arteries demonstrate antegrade flow.  Subclavians: Normal flow hemodynamics were seen in bilateral subclavian arteries.   EKG:  EKG is personally reviewed.  06/23/2021: EKG was not ordered. 01/22/20: sinus rhythm.  Rate 76 bpm.    Recent Labs: 06/17/2021: ALT 17; BUN 18; Creatinine, Ser 1.01; Potassium 4.1; Sodium 140   Recent Lipid Panel    Component Value Date/Time   CHOL 183  06/17/2021 0921   TRIG 286 (H) 06/17/2021 0921   HDL 47 06/17/2021 0921   CHOLHDL 3.9 06/17/2021 0921   LDLCALC 89 06/17/2021 0921    Physical Exam:    VS:  BP (!) 152/72 (BP Location: Right Arm, Patient Position: Sitting, Cuff Size: Normal)    Pulse 79    Ht '5\' 2"'  (1.575 m)    Wt 129 lb 14.4 oz (58.9 kg)    SpO2 98%    BMI 23.76 kg/m  , BMI Body mass index is 23.76 kg/m. GENERAL:  Well appearing HEENT: Pupils equal round and reactive, fundi not visualized, oral mucosa unremarkable NECK:  No jugular venous distention, waveform within normal limits, carotid upstroke brisk and symmetric, no bruits, no thyromegaly LUNGS:  Clear to auscultation bilaterally HEART:  RRR.  PMI not  displaced or sustained,S1 and S2 within normal limits, no S3, no S4, no clicks, no rubs, no murmurs ABD:  Flat, positive bowel sounds normal in frequency in pitch, no bruits, no rebound, no guarding, no midline pulsatile mass, no hepatomegaly, no splenomegaly EXT:  2 plus pulses throughout, no edema, no cyanosis no clubbing SKIN:  No rashes no nodules NEURO:  Cranial nerves II through XII grossly intact, motor grossly intact throughout PSYCH:  Cognitively intact, oriented to person place and time   ASSESSMENT:    1. Atypical chest pain   2. Bilateral carotid artery stenosis   3. Pure hypercholesterolemia   4. Essential (primary) hypertension     PLAN:    Atypical chest pain She is having atypical chest discomfort that seems to be worse with exertion.  She has intermittent risk and is not able to walk on a treadmill.  We will get a coronary CTA to ensure this is not ischemia.  Continue carvedilol and atorvastatin.  On the day of her CT she will take 100 mg of metoprolol, hold her carvedilol, and take 5 mg of ivabradine.  Carotid stenosis Stable, mild disease on carotid Doppler.  Continue lipid management.  Pure hypercholesterolemia Lipids are much better but not at goal.  She notes that she has not been  taking her atorvastatin.  Therefore we will not start her on Repatha at this time.  She is going to get more vigilant with taking it every week and with diet and exercise.  Repeat lipids in 2 to 3 months.  Essential (primary) hypertension Blood pressure is very elevated in the office but has been reasonably controlled at home.  Her goal is less than 130/80.  She notes it was much better controlled when she was exercising.  Now that she is back in the city she plans to start back exercising.  She is going to keep checking her pressure and if it remains elevated at next visit we will need to add something as well as undergo a work-up of secondary causes.    Disposition:    Follow up with Sandra Pleitez C. Oval Linsey, MD, [facility] in 3 months.  Medication Adjustments/Labs and Tests Ordered: Current medicines are reviewed at length with the patient today.  Concerns regarding medicines are outlined above.   Orders Placed This Encounter  Procedures   CT CORONARY MORPH W/CTA COR W/SCORE W/CA W/CM &/OR WO/CM   Basic metabolic panel   Lipid panel   Comprehensive metabolic panel   Meds ordered this encounter  Medications   ivabradine (CORLANOR) 5 MG TABS tablet    Sig: TAKE 1 TABLET 2 HOURS PRIOR TO CT    Dispense:  1 tablet    Refill:  0   metoprolol tartrate (LOPRESSOR) 100 MG tablet    Sig: TAKE 1 TABLET 2 HOURS PRIOR TO CT    Dispense:  1 tablet    Refill:  0   I,Mathew Stumpf,acting as a scribe for Skeet Latch, MD.,have documented all relevant documentation on the behalf of Skeet Latch, MD,as directed by  Skeet Latch, MD while in the presence of Skeet Latch, MD.  I, [place] Oval Linsey, MD have reviewed all documentation for this visit.  The documentation of the exam, diagnosis, procedures, and orders on 06/23/2021 are all accurate and complete.   Signed, Skeet Latch, MD  06/23/2021 5:40 PM    [facility]

## 2021-06-21 ENCOUNTER — Other Ambulatory Visit: Payer: Self-pay

## 2021-06-21 ENCOUNTER — Ambulatory Visit (INDEPENDENT_AMBULATORY_CARE_PROVIDER_SITE_OTHER): Payer: Medicare PPO

## 2021-06-21 DIAGNOSIS — I6523 Occlusion and stenosis of bilateral carotid arteries: Secondary | ICD-10-CM

## 2021-06-23 ENCOUNTER — Encounter (HOSPITAL_BASED_OUTPATIENT_CLINIC_OR_DEPARTMENT_OTHER): Payer: Self-pay | Admitting: Cardiovascular Disease

## 2021-06-23 ENCOUNTER — Ambulatory Visit (HOSPITAL_BASED_OUTPATIENT_CLINIC_OR_DEPARTMENT_OTHER): Payer: Medicare PPO | Admitting: Cardiovascular Disease

## 2021-06-23 ENCOUNTER — Other Ambulatory Visit: Payer: Self-pay

## 2021-06-23 DIAGNOSIS — I6523 Occlusion and stenosis of bilateral carotid arteries: Secondary | ICD-10-CM | POA: Diagnosis not present

## 2021-06-23 DIAGNOSIS — I10 Essential (primary) hypertension: Secondary | ICD-10-CM

## 2021-06-23 DIAGNOSIS — E7800 Pure hypercholesterolemia, unspecified: Secondary | ICD-10-CM | POA: Diagnosis not present

## 2021-06-23 DIAGNOSIS — R0789 Other chest pain: Secondary | ICD-10-CM | POA: Insufficient documentation

## 2021-06-23 HISTORY — DX: Other chest pain: R07.89

## 2021-06-23 MED ORDER — METOPROLOL TARTRATE 100 MG PO TABS: ORAL_TABLET | ORAL | 0 refills | Status: DC

## 2021-06-23 MED ORDER — IVABRADINE HCL 5 MG PO TABS: ORAL_TABLET | ORAL | 0 refills | Status: DC

## 2021-06-23 NOTE — Assessment & Plan Note (Signed)
She is having atypical chest discomfort that seems to be worse with exertion.  She has intermittent risk and is not able to walk on a treadmill.  We will get a coronary CTA to ensure this is not ischemia.  Continue carvedilol and atorvastatin.  On the day of her CT she will take 100 mg of metoprolol, hold her carvedilol, and take 5 mg of ivabradine.

## 2021-06-23 NOTE — Assessment & Plan Note (Signed)
Blood pressure is very elevated in the office but has been reasonably controlled at home.  Her goal is less than 130/80.  She notes it was much better controlled when she was exercising.  Now that she is back in the city she plans to start back exercising.  She is going to keep checking her pressure and if it remains elevated at next visit we will need to add something as well as undergo a work-up of secondary causes.

## 2021-06-23 NOTE — Patient Instructions (Addendum)
Medication Instructions:  THE DAY OF YOUR CARDIAC CT DO NOT TAKE YOUR CARVEDILOL  BUT TAKE METOPROLOL 100 MG AND IVABRADINE 5 MG 2 HOURS PRIOR TO CT     *If you need a refill on your cardiac medications before your next appointment, please call your pharmacy*  Lab Work: BMET WITHIN 1 WEEK OF CARDIAC CT   LP/CMET IN ABOUT 3 MONTHS PRIOR TO FOLLOW UP   If you have labs (blood work) drawn today and your tests are completely normal, you will receive your results only by: MyChart Message (if you have MyChart) OR A paper copy in the mail If you have any lab test that is abnormal or we need to change your tratment, we will call you to review the results.  Testing/Procedures: Your physician has requested that you have cardiac CT. Cardiac computed tomography (CT) is a painless test that uses an x-ray machine to take clear, detailed pictures of your heart. For further information please visit [email or web address] Please follow instruction sheet as given.  Follow-Up: At [facility], you and your health needs are our priority.  As part of our continuing mission to provide you with exceptional heart care, we have created designated Provider Care Teams.  These Care Teams include your primary Cardiologist (physician) and Advanced Practice Providers (APPs -  Physician Assistants and Nurse Practitioners) who all work together to provide you with the care you need, when you need it.  We recommend signing up for the patient portal called "MyChart".  Sign up information is provided on this After Visit Summary.  MyChart is used to connect with patients for Virtual Visits (Telemedicine).  Patients are able to view lab/test results, encounter notes, upcoming appointments, etc.  Non-urgent messages can be sent to your provider as well.   To learn more about what you can do with MyChart, go to [email or web address].    Your next appointment:   3 month(s)  The format for your next appointment:   In  Person  Provider:   Chilton Si, MD   Other Instructions    Your cardiac CT will be scheduled at one of the below locations:   [facility] [address] [place], [place] [address] [phone]  OR  [facility] [address] [place], [place] [address] [phone]  If scheduled at [facility], please arrive at the [facility] main entrance (entrance A) of [facility] 30 minutes prior to test start time. You can use the FREE valet parking offered at the main entrance (encouraged to control the heart rate for the test) Proceed to the [facility] Radiology Department (first floor) to check-in and test prep.  If scheduled at [facility], please arrive 15 mins early for check-in and test prep.  Please follow these instructions carefully (unless otherwise directed):  Hold all erectile dysfunction medications at least 3 days (72 hrs) prior to test.  On the Night Before the Test: Be sure to Drink plenty of water. Do not consume any caffeinated/decaffeinated beverages or chocolate 12 hours prior to your test. Do not take any antihistamines 12 hours prior to your test. If the patient has contrast allergy: Patient will need a prescription for Prednisone and very clear instructions (as follows): Prednisone 50 mg - take 13 hours prior to test Take another Prednisone 50 mg 7 hours prior to test Take another Prednisone 50 mg 1 hour prior to test Take Benadryl 50 mg 1 hour prior to  test Patient must complete all four doses of above prophylactic medications. Patient will need a ride after test due to Benadryl.  On the Day of the Test: Drink plenty of water until 1 hour prior to the test. Do not eat any food 4 hours prior to the test. You may take your regular medications prior to the test.  Take metoprolol (Lopressor) two hours prior to test. HOLD Furosemide/Hydrochlorothiazide  morning of the test. FEMALES- please wear underwire-free bra if available, avoid dresses & tight clothing  After the Test: Drink plenty of water. After receiving IV contrast, you may experience a mild flushed feeling. This is normal. On occasion, you may experience a mild rash up to 24 hours after the test. This is not dangerous. If this occurs, you can take Benadryl 25 mg and increase your fluid intake. If you experience trouble breathing, this can be serious. If it is severe call 911 IMMEDIATELY. If it is mild, please call our office. If you take any of these medications: Glipizide/Metformin, Avandament, Glucavance, please do not take 48 hours after completing test unless otherwise instructed.  We will call to schedule your test 2-4 weeks out understanding that some insurance companies will need an authorization prior to the service being performed.   For non-scheduling related questions, please contact the cardiac imaging nurse navigator should you have any questions/concerns: Rockwell AlexandriaSara Wallace, Cardiac Imaging Nurse Navigator Larey BrickMerle Prescott, Cardiac Imaging Nurse Navigator [facility] Direct Office Dial: [phone]   For scheduling needs, including cancellations and rescheduling, please call [place], [phone].  Cardiac CT Angiogram A cardiac CT angiogram is a procedure to look at the heart and the area around the heart. It may be done to help find the cause of chest pains or other symptoms of heart disease. During this procedure, a substance called contrast dye is injected into the blood vessels in the area to be checked. A large X-ray machine, called a CT scanner, then takes detailed pictures of the heart and the surrounding area. The procedure is also sometimes called a coronary CT angiogram, coronary artery scanning, or CTA. A cardiac CT angiogram allows the health care provider to see how well blood is flowing to and from the heart. The health care provider  will be able to see if there are any problems, such as: Blockage or narrowing of the coronary arteries in the heart. Fluid around the heart. Signs of weakness or disease in the muscles, valves, and tissues of the heart. Tell a health care provider about: Any allergies you have. This is especially important if you have had a previous allergic reaction to contrast dye. All medicines you are taking, including vitamins, herbs, eye drops, creams, and over-the-counter medicines. Any blood disorders you have. Any surgeries you have had. Any medical conditions you have. Whether you are pregnant or may be pregnant. Any anxiety disorders, chronic pain, or other conditions you have that may increase your stress or prevent you from lying still. What are the risks? Generally, this is a safe procedure. However, problems may occur, including: Bleeding. Infection. Allergic reactions to medicines or dyes. Damage to other structures or organs. Kidney damage from the contrast dye that is used. Increased risk of cancer from radiation exposure. This risk is low. Talk with your health care provider about: The risks and benefits of testing. How you can receive the lowest dose of radiation. What happens before the procedure? Wear comfortable clothing and remove any jewelry, glasses, dentures, and hearing aids. Follow  instructions from your health care provider about eating and drinking. This may include: For 12 hours before the procedure -- avoid caffeine. This includes tea, coffee, soda, energy drinks, and diet pills. Drink plenty of water or other fluids that do not have caffeine in them. Being well hydrated can prevent complications. For 4-6 hours before the procedure -- stop eating and drinking. The contrast dye can cause nausea, but this is less likely if your stomach is empty. Ask your health care provider about changing or stopping your regular medicines. This is especially important if you are taking  diabetes medicines, blood thinners, or medicines to treat problems with erections (erectile dysfunction). What happens during the procedure?  Hair on your chest may need to be removed so that small sticky patches called electrodes can be placed on your chest. These will transmit information that helps to monitor your heart during the procedure. An IV will be inserted into one of your veins. You might be given a medicine to control your heart rate during the procedure. This will help to ensure that good images are obtained. You will be asked to lie on an exam table. This table will slide in and out of the CT machine during the procedure. Contrast dye will be injected into the IV. You might feel warm, or you may get a metallic taste in your mouth. You will be given a medicine called nitroglycerin. This will relax or dilate the arteries in your heart. The table that you are lying on will move into the CT machine tunnel for the scan. The person running the machine will give you instructions while the scans are being done. You may be asked to: Keep your arms above your head. Hold your breath. Stay very still, even if the table is moving. When the scanning is complete, you will be moved out of the machine. The IV will be removed. The procedure may vary among health care providers and hospitals. What can I expect after the procedure? After your procedure, it is common to have: A metallic taste in your mouth from the contrast dye. A feeling of warmth. A headache from the nitroglycerin. Follow these instructions at home: Take over-the-counter and prescription medicines only as told by your health care provider. If you are told, drink enough fluid to keep your urine pale yellow. This will help to flush the contrast dye out of your body. Most people can return to their normal activities right after the procedure. Ask your health care provider what activities are safe for you. It is up to you to get  the results of your procedure. Ask your health care provider, or the department that is doing the procedure, when your results will be ready. Keep all follow-up visits as told by your health care provider. This is important. Contact a health care provider if: You have any symptoms of allergy to the contrast dye. These include: Shortness of breath. Rash or hives. A racing heartbeat. Summary A cardiac CT angiogram is a procedure to look at the heart and the area around the heart. It may be done to help find the cause of chest pains or other symptoms of heart disease. During this procedure, a large X-ray machine, called a CT scanner, takes detailed pictures of the heart and the surrounding area after a contrast dye has been injected into blood vessels in the area. Ask your health care provider about changing or stopping your regular medicines before the procedure. This is especially important if you  are taking diabetes medicines, blood thinners, or medicines to treat erectile dysfunction. If you are told, drink enough fluid to keep your urine pale yellow. This will help to flush the contrast dye out of your body. This information is not intended to replace advice given to you by your health care provider. Make sure you discuss any questions you have with your health care provider. Document Revised: 01/05/2021 Document Reviewed: 12/18/2018 Elsevier Patient Education  2022 [employer].

## 2021-06-23 NOTE — Assessment & Plan Note (Signed)
Stable, mild disease on carotid Doppler.  Continue lipid management.

## 2021-06-23 NOTE — Assessment & Plan Note (Signed)
Lipids are much better but not at goal.  She notes that she has not been taking her atorvastatin.  Therefore we will not start her on Repatha at this time.  She is going to get more vigilant with taking it every week and with diet and exercise.  Repeat lipids in 2 to 3 months.

## 2021-07-05 ENCOUNTER — Encounter: Payer: Self-pay | Admitting: Internal Medicine

## 2021-07-05 ENCOUNTER — Ambulatory Visit: Payer: Medicare PPO | Admitting: Internal Medicine

## 2021-07-05 ENCOUNTER — Telehealth (HOSPITAL_COMMUNITY): Payer: Self-pay | Admitting: *Deleted

## 2021-07-05 ENCOUNTER — Other Ambulatory Visit: Payer: Self-pay

## 2021-07-05 VITALS — BP 128/88 | HR 76 | Ht 62.0 in | Wt 131.4 lb

## 2021-07-05 DIAGNOSIS — R809 Proteinuria, unspecified: Secondary | ICD-10-CM

## 2021-07-05 DIAGNOSIS — E1165 Type 2 diabetes mellitus with hyperglycemia: Secondary | ICD-10-CM

## 2021-07-05 DIAGNOSIS — E785 Hyperlipidemia, unspecified: Secondary | ICD-10-CM

## 2021-07-05 LAB — POCT GLYCOSYLATED HEMOGLOBIN (HGB A1C): Hemoglobin A1C: 7.6 % — AB (ref 4.0–5.6)

## 2021-07-05 NOTE — Telephone Encounter (Signed)
Patient returning call regarding upcoming cardiac imaging study; pt verbalizes understanding of appt date/time, parking situation and where to check in, pre-test NPO status and medications ordered, and verified current allergies; name and call back number provided for further questions should they arise  Larey Brick RN Navigator Cardiac Imaging [facility] [phone] office [phone] cell  Patient to take 100mg  metoprolol tartrate and 5mg  ivabradine two hours prior to her cardiac CT scan. She is aware to arrive at 1:30pm for her 2pm scan.

## 2021-07-05 NOTE — Patient Instructions (Addendum)
Please continue: - Metformin ER 500 mg with dinner - Glipizide XL 2.5 mg before b'fast  Please restart: - Trulicity 3 mg weekly  Please return in 4 months with your sugar log

## 2021-07-05 NOTE — Progress Notes (Signed)
Patient ID: [name], female   DOB: 18-Oct-1945, 76 y.o.   MRN: [id number]   This visit occurred during the SARS-CoV-2 public health emergency.  Safety protocols were in place, including screening questions prior to the visit, additional usage of staff PPE, and extensive cleaning of exam room while observing appropriate contact time as indicated for disinfecting solutions.   HPI: Sandra [name] is a 76 y.o.-year-old female, returning for f/u for DM2, dx in 2015, non-insulin-dependent, uncontrolled, with long term complications (MAU, bilateral carotid artery stenosis). Last visit 4 months ago.  Interim history: No increased urination, blurry vision, nausea, chest pain. She is seen in the Hypertension clinic. Sees Dr. Oval Linsey. She was not able to obtain the 3 mg of Trulicity >> switched to 1.5 mg weekly for ~1 mo. Just got the 3 mg dose.  She had eructations - resolved. She will have coronary CT tomorrow.  Reviewed HbA1c levels: Lab Results  Component Value Date   HGBA1C 6.9 (A) 03/03/2021   HGBA1C 7.1 (A) 10/28/2020   HGBA1C 6.6 (A) 07/01/2020   HGBA1C 8.5 (A) 03/09/2020   HGBA1C 7.4 (A) 11/04/2019   HGBA1C 8.1 (A) 07/17/2019   HGBA1C 7.0 (A) 03/13/2019   HGBA1C 6.8 (A) 11/11/2018   HGBA1C 7.6 (A) 07/01/2018   HGBA1C 8.2 (A) 02/25/2018   HGBA1C 9.2 (A) 10/12/2017   HGBA1C 7.4 06/13/2017   HGBA1C 7.4 03/13/2017   HGBA1C 7.5 12/11/2016   HGBA1C 7.7 07/10/2016   HGBA1C 7.3 04/10/2016   HGBA1C 7.7 12/15/2015  07/30/2019: HbA1c 8.3% 10/2015: HbA1c 8.2%  Pt is on a regimen of: - Metformin ER 500 mg at bedtime >> stopped 2/2 recall >> restarted 500 mg at bedtime-she could not tolerate higher doses due to diarrhea - Glipizide XL 2.5 mg daily - Trulicity 1.5 mg >> 3 mg weekly-no GI side effects She was on Jardiance 10 mg daily in am - started 07/2016 >> could not tolerate it b/c increased urination and hyperglycemia  She was on Metformin 500 mg 1x a day with dinner (500 mg  tid >> AP/diarrhea)  >> stopped when started Januvia  Pt checks her sugars once a day: - am:   148-220 >> 130-199 >> 137-154, 180 >> 115-140s >> 131-182 - 2h after b'fast:  170s >> 135-161, 258 >> n/c - before lunch:  125 >> 150s >> 160-180 >> n/c - 2h after lunch:  142 >> n/c >> 125-158 >> 200 >> n/c - before dinner: 128-165 >> n/c >> 196 >> n/c - 2h after dinner: n/c >> 137 >> n/c - bedtime: 163, 164 >> 150s-160s >> 150s >> 140s-150s >> 138 - nighttime: n/c >> 169-208 >> n/c It is unclear at which CBG level she has hypoglycemia awareness. Lowest: 110 Highest:  199 >> 180 >> 215 x1.  Glucometer: OneTouch >> AccuChek  Her GFR is not decreased, but she has microalbuminuria: Lab Results  Component Value Date   BUN 18 06/17/2021   BUN 20 06/02/2020   CREATININE 1.01 (H) 06/17/2021   CREATININE 0.95 06/02/2020   Lab Results  Component Value Date   MICRALBCREAT 5.6 11/11/2018  08/11/2020: 17/0.88, ACR 94.6 07/30/2019: ACR 36.9, BUN/creatinine 19/0.86 07/09/2018: glucose of 173.  BUN/creatinine 13/0.75, GFR 92, ACR 96.3 07/04/2017: 21/0.73, ACR not checked 01/12/2017: Glu 181, 18/0.8, eGFR 86, ACR 42.1 10/22/2015: 21/0.79, ACR 35.6 On Micardis 80.  + HL; last set of lipids: Lab Results  Component Value Date   CHOL 183 06/17/2021   HDL 47 06/17/2021  LDLCALC 89 06/17/2021   TRIG 286 (H) 06/17/2021   CHOLHDL 3.9 06/17/2021  08/11/2020: 201/202/42/124 07/30/2019: 208/278/42/118 07/09/2018: 195/250/40/106 07/04/2017: 207/381/40/91 01/12/2017: 199/380/41/82 10/22/2015: 226/201/48/138  Previously on rosuvastatin 5 mg weekly >> then Lipitor 10 mg weekly >> advised her to take it 3 times a week at least. She stopped since last OV >> recently advised to restart at least 1x a week.  - last eye exam was in 09/2020: No DR; has high intraocular pressure.  On eyedrops.  She sees Dr. Prudencio Burly.  She has a history of cataract surgery.  - no numbness and tingling in her feet.   TSH on 08/11/2020:  1.10  B12 on 08/11/2020: 261  ROS: + see HPI  I reviewed pt's medications, allergies, PMH, social hx, family hx, and changes were documented in the history of present illness. Otherwise, unchanged from my initial visit note.  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   Cervical pain 12/15/2015   Diverticulitis of colon 12/15/2015   Acid reflux 12/15/2015   Essential (primary) hypertension 12/15/2015   Benign breast lumps 12/15/2015   Knee pain 11/08/2011   Social History   Social History   Marital status: Married    Spouse name: N/A   Number of children: 2   Occupational History   Retired    Social History Main Topics   Smoking status: Never Smoker   Smokeless tobacco: Never Used   Alcohol use Socially    Drug use: No    Current Outpatient Medications on File Prior to Visit  Medication Sig Dispense Refill   amLODipine (NORVASC) 10 MG tablet TAKE 1 TABLET BY MOUTH EVERY DAY 90 tablet 3   atorvastatin (LIPITOR) 10 MG tablet Take 1 tablet (10 mg total) by mouth as directed. TAKE 1 TABLET ONCE A WEEK 12 tablet 1   Blood Glucose Monitoring Suppl (ONETOUCH VERIO) w/Device KIT Use to check blood sugar daily 1 kit 0   carvedilol (COREG) 12.5 MG tablet TAKE 1 TABLET BY MOUTH 2 TIMES DAILY. 180 tablet 1   COVID-19 mRNA vaccine, Moderna, 100 MCG/0.5ML injection Inject into the muscle. 0.25 mL 0   Dulaglutide (TRULICITY) 1.5 YT/0.3TW SOPN Inject 3 mg into the skin once a week. 4 mL 11   glipiZIDE (GLUCOTROL XL) 2.5 MG 24 hr tablet TAKE 1 TABLET (2.5 MG TOTAL) BY MOUTH 2 (TWO) TIMES DAILY BEFORE A MEAL. 90 tablet 3   glucose blood (ONETOUCH VERIO) test strip CHECK BLOOD SUGAR  IN THE MORNING PRIOR TO BREAKFAST 100 strip 4   Insulin Pen Needle 32G X 4 MM MISC Use to inject insulin weekly 100 each 5   ivabradine (CORLANOR) 5 MG TABS tablet TAKE 1 TABLET 2 HOURS PRIOR TO CT 1 tablet 0   Lancets (ONETOUCH DELICA PLUS [id number]) MISC USE TO CHECK BLOOD SUGAR TWO TIMES A DAY. 100 each 2   latanoprost (XALATAN) 0.005 % ophthalmic solution Place 1 drop into both eyes at bedtime.     metoprolol tartrate (LOPRESSOR) 100 MG tablet TAKE 1 TABLET 2 HOURS PRIOR TO CT 1 tablet 0   MICARDIS HCT 80-25 MG per tablet Take 1 tablet by mouth daily.     Multiple Vitamins-Minerals (WOMENS  MULTIVITAMIN PO) Take by mouth daily.     omeprazole (PRILOSEC) 20 MG capsule Take 20 mg by mouth as needed (HEARTBURN).     No current facility-administered medications on file prior to visit.    Allergies  Allergen Reactions   Metformin Hcl Er Other (See Comments)   Sulfa Antibiotics Rash   Family History  Problem Relation Age of Onset   Peripheral Artery Disease Sister    Stroke Brother    Diabetes Brother    Heart attack Brother    Lung cancer Sister    Stroke Brother    CAD Brother    Heart disease Sister     PE: BP 128/88 (BP Location: Right Arm, Patient Position: Sitting, Cuff Size: Normal)    Pulse 76    Ht '5\' 2"'  (1.575 m)    Wt 131 lb 6.4 oz (59.6 kg)    SpO2 98%    BMI 24.03 kg/m  Wt Readings from Last 3 Encounters:  07/05/21 131 lb 6.4 oz (59.6 kg)  06/23/21 129 lb 14.4 oz (58.9 kg)  05/06/21 129 lb 6.4 oz (58.7 kg)   Constitutional: normal weight, in NAD Eyes: PERRLA, EOMI, no exophthalmos ENT: moist mucous membranes, no thyromegaly, no cervical lymphadenopathy Cardiovascular: RRR, No MRG Respiratory: CTA B Musculoskeletal: no deformities, strength intact in all 4 Skin: moist, warm, no rashes Neurological: no tremor with outstretched hands, DTR normal in all 4  ASSESSMENT: 1. DM2, non-insulin-dependent, uncontrolled, with with complications -Bilateral carotid artery  stenosis  2. Microalbuminuria  3. HL  PLAN:  1. Patient with longstanding, uncontrolled, type 2 diabetes, with improved control after she completed a 12-week exercise and nutritional program at the [facility].  HbA1c decreased to 6.6% afterwards.  However, afterwards, sugars increased to an HbA1c of 7.1%.  At last visit, however, HbA1c was better, at 6.9%.  Sugars are mostly at goal throughout the day with occasional values in the 140s in the morning especially when forgetting metformin doses at night.  She was not eating well as she had more stress with family members being sick, which is why she was forgetting metformin doses.  We did not change her regimen at that time.  I refilled all of her diabetic medicines. - at today's visit, sugars are higher -she only brings records of sugars checked in the last week, and they are above target in the morning.  She is not checking later in the day except for one value, at goal, at bedtime.  She mentions that sugars started to increase after she was forced to back off the Trulicity dose from 3 mg to 1.5 mg weekly due to availability.  However, yesterday she was able to refill the 3 mg dose for 3 months.  I advised her to start this back.  We can continue the rest of the regimen for now. - I suggested to:  Patient Instructions  Please continue: - Metformin ER 500 mg with dinner - Trulicity 3 mg weekly - Glipizide XL 2.5 mg before b'fast  Please return in 4 months with your sugar log.   - we checked her HbA1c: 7.6% (higher) - advised to check sugars at different times of the day - 1x a day, rotating check times - advised for yearly eye exams >> she is UTD - she is due for a foot exam - will check at next OV - return to clinic in 4 months  2. Microalbuminuria - Likely related to diabetes - She continues on my Cardizem 80  mg daily  3. HL -Reviewed latest lipid panel from earlier this month: LDL and triglycerides above target: Lab Results  Component  Value Date   CHOL 183 06/17/2021   HDL 47 06/17/2021   LDLCALC 89 06/17/2021   TRIG 286 (H) 06/17/2021   CHOLHDL 3.9 06/17/2021  -On Lipitor 10 mg 3 times a week -She has recently been investigated for bilateral carotid artery stenosis and will also have a coronary CT performed tomorrow.  Philemon Kingdom, MD PhD [facility] Endocrinology

## 2021-07-05 NOTE — Telephone Encounter (Signed)
Attempted to call patient regarding upcoming cardiac CT appointment. °Left message on voicemail with name and callback number ° °Claudie Rathbone RN Navigator Cardiac Imaging °[facility] °[phone] Office °[phone] Cell ° °

## 2021-07-06 ENCOUNTER — Ambulatory Visit (HOSPITAL_COMMUNITY)
Admission: RE | Admit: 2021-07-06 | Discharge: 2021-07-06 | Disposition: A | Discharge status: Home / Self Care | Payer: Medicare PPO | Source: Ambulatory Visit | Attending: Cardiovascular Disease | Admitting: Cardiovascular Disease

## 2021-07-06 DIAGNOSIS — I2510 Atherosclerotic heart disease of native coronary artery without angina pectoris: Secondary | ICD-10-CM | POA: Insufficient documentation

## 2021-07-06 DIAGNOSIS — I6523 Occlusion and stenosis of bilateral carotid arteries: Secondary | ICD-10-CM | POA: Insufficient documentation

## 2021-07-06 DIAGNOSIS — I700 Atherosclerosis of aorta: Secondary | ICD-10-CM | POA: Diagnosis not present

## 2021-07-06 DIAGNOSIS — R0789 Other chest pain: Secondary | ICD-10-CM | POA: Insufficient documentation

## 2021-07-06 MED ORDER — METOPROLOL TARTRATE 5 MG/5ML IV SOLN
5.0000 mg | INTRAVENOUS | Status: DC | PRN
  Administered 2021-07-06: 10 mg via INTRAVENOUS

## 2021-07-06 MED ORDER — DILTIAZEM HCL 25 MG/5ML IV SOLN
INTRAVENOUS | Status: AC
  Filled 2021-07-06: qty 5

## 2021-07-06 MED ORDER — IOHEXOL 350 MG/ML SOLN
100.0000 mL | Freq: Once | INTRAVENOUS | Status: AC | PRN
  Administered 2021-07-06: 100 mL via INTRAVENOUS

## 2021-07-06 MED ORDER — METOPROLOL TARTRATE 5 MG/5ML IV SOLN
INTRAVENOUS | Status: AC
  Filled 2021-07-06: qty 10

## 2021-07-06 MED ORDER — DILTIAZEM HCL 25 MG/5ML IV SOLN: 5.0000 mg | INTRAVENOUS | Status: DC | PRN

## 2021-07-06 MED ORDER — NITROGLYCERIN 0.4 MG SL SUBL
SUBLINGUAL_TABLET | SUBLINGUAL | Status: AC
Start: 2021-07-06 — End: 2021-07-06
  Administered 2021-07-06: 0.8 mg via SUBLINGUAL
  Filled 2021-07-06: qty 2

## 2021-07-06 MED ORDER — NITROGLYCERIN 0.4 MG SL SUBL: 0.8000 mg | SUBLINGUAL_TABLET | Freq: Once | SUBLINGUAL | Status: AC

## 2021-07-08 ENCOUNTER — Telehealth (HOSPITAL_BASED_OUTPATIENT_CLINIC_OR_DEPARTMENT_OTHER): Payer: Self-pay | Admitting: Cardiovascular Disease

## 2021-07-08 NOTE — Telephone Encounter (Signed)
Follow Up: ? ? ? ? ?Patient said she saw her results on My Chart for her CT. She would like for somebody to please call and explain it a little more. ?

## 2021-07-08 NOTE — Telephone Encounter (Signed)
Pt calling for CT results. Pt informed that nurse would call with results once preliminary report is reviewed by Dr. Duke Salvia. Pt verbalized understanding.  ?

## 2021-07-15 ENCOUNTER — Encounter (HOSPITAL_BASED_OUTPATIENT_CLINIC_OR_DEPARTMENT_OTHER): Payer: Self-pay | Admitting: Cardiovascular Disease

## 2021-07-20 ENCOUNTER — Other Ambulatory Visit: Payer: Self-pay | Admitting: Family

## 2021-07-20 NOTE — Telephone Encounter (Signed)
Rx(s) sent to pharmacy electronically.  

## 2021-07-27 ENCOUNTER — Other Ambulatory Visit (HOSPITAL_BASED_OUTPATIENT_CLINIC_OR_DEPARTMENT_OTHER): Payer: Self-pay | Admitting: Cardiovascular Disease

## 2021-07-27 DIAGNOSIS — I779 Disorder of arteries and arterioles, unspecified: Secondary | ICD-10-CM

## 2021-08-08 DIAGNOSIS — K449 Diaphragmatic hernia without obstruction or gangrene: Secondary | ICD-10-CM | POA: Diagnosis not present

## 2021-08-08 DIAGNOSIS — R1013 Epigastric pain: Secondary | ICD-10-CM | POA: Diagnosis not present

## 2021-08-11 DIAGNOSIS — R1013 Epigastric pain: Secondary | ICD-10-CM | POA: Diagnosis not present

## 2021-08-24 DIAGNOSIS — I2510 Atherosclerotic heart disease of native coronary artery without angina pectoris: Secondary | ICD-10-CM | POA: Diagnosis not present

## 2021-08-24 DIAGNOSIS — Z0000 Encounter for general adult medical examination without abnormal findings: Secondary | ICD-10-CM | POA: Diagnosis not present

## 2021-08-24 DIAGNOSIS — E119 Type 2 diabetes mellitus without complications: Secondary | ICD-10-CM | POA: Diagnosis not present

## 2021-08-24 DIAGNOSIS — I10 Essential (primary) hypertension: Secondary | ICD-10-CM | POA: Diagnosis not present

## 2021-08-24 DIAGNOSIS — E1169 Type 2 diabetes mellitus with other specified complication: Secondary | ICD-10-CM | POA: Diagnosis not present

## 2021-08-24 DIAGNOSIS — R1011 Right upper quadrant pain: Secondary | ICD-10-CM | POA: Diagnosis not present

## 2021-08-24 DIAGNOSIS — E611 Iron deficiency: Secondary | ICD-10-CM | POA: Diagnosis not present

## 2021-08-24 DIAGNOSIS — N8110 Cystocele, unspecified: Secondary | ICD-10-CM | POA: Diagnosis not present

## 2021-08-24 DIAGNOSIS — Z1389 Encounter for screening for other disorder: Secondary | ICD-10-CM | POA: Diagnosis not present

## 2021-08-24 DIAGNOSIS — E538 Deficiency of other specified B group vitamins: Secondary | ICD-10-CM | POA: Diagnosis not present

## 2021-08-24 DIAGNOSIS — E559 Vitamin D deficiency, unspecified: Secondary | ICD-10-CM | POA: Diagnosis not present

## 2021-08-24 DIAGNOSIS — H9313 Tinnitus, bilateral: Secondary | ICD-10-CM | POA: Diagnosis not present

## 2021-09-07 DIAGNOSIS — H401112 Primary open-angle glaucoma, right eye, moderate stage: Secondary | ICD-10-CM | POA: Diagnosis not present

## 2021-09-07 DIAGNOSIS — H35351 Cystoid macular degeneration, right eye: Secondary | ICD-10-CM | POA: Diagnosis not present

## 2021-09-07 DIAGNOSIS — H2512 Age-related nuclear cataract, left eye: Secondary | ICD-10-CM | POA: Diagnosis not present

## 2021-09-07 DIAGNOSIS — E119 Type 2 diabetes mellitus without complications: Secondary | ICD-10-CM | POA: Diagnosis not present

## 2021-09-07 LAB — HM DIABETES EYE EXAM

## 2021-09-12 ENCOUNTER — Encounter: Payer: Self-pay | Admitting: Internal Medicine

## 2021-09-27 ENCOUNTER — Ambulatory Visit (HOSPITAL_BASED_OUTPATIENT_CLINIC_OR_DEPARTMENT_OTHER): Payer: Medicare PPO | Admitting: Cardiovascular Disease

## 2021-10-13 ENCOUNTER — Encounter (HOSPITAL_BASED_OUTPATIENT_CLINIC_OR_DEPARTMENT_OTHER): Payer: Self-pay | Admitting: Family

## 2021-10-13 ENCOUNTER — Ambulatory Visit (HOSPITAL_BASED_OUTPATIENT_CLINIC_OR_DEPARTMENT_OTHER): Payer: Medicare PPO | Admitting: Family

## 2021-10-13 VITALS — BP 126/76 | HR 74 | Ht 62.0 in | Wt 132.4 lb

## 2021-10-13 DIAGNOSIS — E7800 Pure hypercholesterolemia, unspecified: Secondary | ICD-10-CM

## 2021-10-13 DIAGNOSIS — I6523 Occlusion and stenosis of bilateral carotid arteries: Secondary | ICD-10-CM | POA: Diagnosis not present

## 2021-10-13 DIAGNOSIS — E785 Hyperlipidemia, unspecified: Secondary | ICD-10-CM | POA: Diagnosis not present

## 2021-10-13 DIAGNOSIS — I10 Essential (primary) hypertension: Secondary | ICD-10-CM

## 2021-10-13 DIAGNOSIS — I25118 Atherosclerotic heart disease of native coronary artery with other forms of angina pectoris: Secondary | ICD-10-CM | POA: Diagnosis not present

## 2021-10-13 MED ORDER — ASPIRIN 81 MG PO TBEC: 81.0000 mg | DELAYED_RELEASE_TABLET | Freq: Every day | ORAL | 3 refills | Status: DC

## 2021-10-13 NOTE — Progress Notes (Signed)
Office Visit    Patient Name: Sandra [name] Date of Encounter: 10/13/2021  PCP:  Josetta Huddle, MD   [place]  Cardiologist:  Skeet Latch, MD  Advanced Practice Provider:  No care team member to display Electrophysiologist:  None    Chief Complaint    Sandra [name] is a 76 y.o. female with a hx of diabetes, CAD, hypertension, mild carotid stenosis, HLD presents today for follow-up after cardiac CTA  Past Medical History    Past Medical History:  Diagnosis Date   Atypical chest pain 06/23/2021   Pure hypercholesterolemia 01/22/2020   History reviewed. No pertinent surgical history.  Allergies  Allergies  Allergen Reactions   Metformin Hcl Er Other (See Comments)   Sulfa Antibiotics Rash    History of Present Illness    Sandra [name] is a 76 y.o. female with a hx of diabetes, CAD, hypertension, mild carotid stenosis, HLD last seen 06/23/21  She initially established 01/2020 with the advanced hypertension clinic. Her blood pressure was 150s/90s with associated tinnitus. Amlodipine was increased at that visit and Metoprolol transitioned to Carvedilol. She was enrolled in the PREP program through the [facility]. When seen 05/2020, 11/2020 doing well from a cardiac perspective.   She was evaluated 04/2021 recommended for repeat carotid Dopplers as she had mild bilateral stenosis in 2019.  Her BP was above goal but controlled at home and she preferred to start with lifestyle changes.  Her blood pressure cuff is found to be accurate.  Her carotid Dopplers 06/2021 revealed mild bilateral stenosis.  At follow-up with 06/2021 she noted atypical chest discomfort that seem to be worse with exertion.  Cardiac CTA was ordered and performed 07/2021 revealing coronary calcium score 3.17 placing her in the 44th percentile for age, race, sex matched control with minimal less than 25% calcified plaque in the LCx.  She presents today for follow-up  independently. She has been doing well since her last visit. Her blood pressure at home has been 126-130. She has enjoyed spending time in [place] with her daughter, son in law, and her 88 month old grandson Ouida Sills. Reports no shortness of breath nor dyspnea on exertion. Reports no chest pain, pressure, or tightness. No edema, orthopnea, PND. Reports no palpitations.  She notes she has not been taking her Atorvastatin regularly.   EKGs/Labs/Other Studies Reviewed:   The following studies were reviewed today:  Cardiac CT 07/2021 FINDINGS: A 120 kV prospective scan was triggered in the descending thoracic aorta at 111 HU's. Axial non-contrast 3 mm slices were carried out through the heart. The data set was analyzed on a dedicated work station and scored using the [place]. Gantry rotation speed was 250 msecs and collimation was .6 mm. No beta blockade and 0.8 mg of sl NTG was given. The 3D data set was reconstructed in 5% intervals of the 67-82 % of the R-R cycle. Diastolic phases were analyzed on a dedicated work station using MPR, MIP and VRT modes. The patient received 80 cc of contrast.   Aorta: Normal size.  No calcifications.  No dissection.   Aortic Valve:  Trileaflet.  Aortic atherosclerosis.   Coronary Arteries:  Normal coronary origin.  Right dominance.   RCA is a large dominant artery that gives rise to PDA and PLVB. There is no plaque.   Left main is a large artery that gives rise to LAD and LCX arteries.   LAD is a large vessel that has no plaque.  There are two diagonal vessels without plaque.   LCX is a non-dominant artery that gives rise to two OM branches. There is minimal (<25%) calcified plaque in the proximal and mid LCX.   Coronary Calcium Score:   Left main: 0   Left anterior descending artery: 0   Left circumflex artery: 3.17   Right coronary artery: 0   Total: 3.17   Percentile: 44th   Other findings:   Normal pulmonary vein drainage into the  left atrium.   Normal let atrial appendage without a thrombus.   Normal size of the pulmonary artery.   IMPRESSION: 1. Coronary calcium score of 3.17. This was 44th percentile for age-, race-, and sex-matched controls.   2.  Normal coronary origin with right dominance.   3.  Minimal (<25%) calcified plaque in the LCX.  CAD-RADS 1.   4.  Aortic atherosclerosis.    Bilateral Carotid Dopplers 06/21/2021: Summary:  Right Carotid: Velocities in the right ICA are consistent with a 1-39%  stenosis.   Left Carotid: Velocities in the left ICA are consistent with a 1-39%  stenosis.   Vertebrals:  Bilateral vertebral arteries demonstrate antegrade flow.  Subclavians: Normal flow hemodynamics were seen in bilateral subclavian arteries.   EKG:  EKG is ordered today.  The ekg ordered today demonstrates NSR 74 bpm with no acute ST/T wave changes  Recent Labs: 06/17/2021: ALT 17; BUN 18; Creatinine, Ser 1.01; Potassium 4.1; Sodium 140  Recent Lipid Panel    Component Value Date/Time   CHOL 183 06/17/2021 0921   TRIG 286 (H) 06/17/2021 0921   HDL 47 06/17/2021 0921   CHOLHDL 3.9 06/17/2021 0921   LDLCALC 89 06/17/2021 0921   Home Medications   Current Meds  Medication Sig   amLODipine (NORVASC) 10 MG tablet TAKE 1 TABLET BY MOUTH EVERY DAY   atorvastatin (LIPITOR) 10 MG tablet Take 1 tablet (10 mg total) by mouth as directed. TAKE 1 TABLET ONCE A WEEK   Blood Glucose Monitoring Suppl (ONETOUCH VERIO) w/Device KIT Use to check blood sugar daily   carvedilol (COREG) 12.5 MG tablet TAKE 1 TABLET BY MOUTH TWICE A DAY   COVID-19 mRNA vaccine, Moderna, 100 MCG/0.5ML injection Inject into the muscle.   Dulaglutide (TRULICITY) 1.5 EX/9.3ZJ SOPN Inject 3 mg into the skin once a week.   glipiZIDE (GLUCOTROL XL) 2.5 MG 24 hr tablet TAKE 1 TABLET (2.5 MG TOTAL) BY MOUTH 2 (TWO) TIMES DAILY BEFORE A MEAL.   glucose blood (ONETOUCH VERIO) test strip CHECK BLOOD SUGAR IN THE MORNING PRIOR TO  BREAKFAST   Insulin Pen Needle 32G X 4 MM MISC Use to inject insulin weekly   ivabradine (CORLANOR) 5 MG TABS tablet TAKE 1 TABLET 2 HOURS PRIOR TO CT   Lancets (ONETOUCH DELICA PLUS [id number]) MISC USE TO CHECK BLOOD SUGAR TWO TIMES A DAY.   latanoprost (XALATAN) 0.005 % ophthalmic solution Place 1 drop into both eyes at bedtime.   metoprolol tartrate (LOPRESSOR) 100 MG tablet TAKE 1 TABLET 2 HOURS PRIOR TO CT   MICARDIS HCT 80-25 MG per tablet Take 1 tablet by mouth daily.   Multiple Vitamins-Minerals (WOMENS MULTIVITAMIN PO) Take by mouth daily.   omeprazole (PRILOSEC) 20 MG capsule Take 20 mg by mouth as needed (HEARTBURN).     Review of Systems      All other systems reviewed and are otherwise negative except as noted above.  Physical Exam    VS:  BP 126/76   Pulse 74  Ht '5\' 2"'  (1.575 m)   Wt 132 lb 6.4 oz (60.1 kg)   SpO2 99%   BMI 24.22 kg/m  , BMI Body mass index is 24.22 kg/m.  Wt Readings from Last 3 Encounters:  10/13/21 132 lb 6.4 oz (60.1 kg)  07/05/21 131 lb 6.4 oz (59.6 kg)  06/23/21 129 lb 14.4 oz (58.9 kg)    GEN: Well nourished, well developed, in no acute distress. HEENT: normal. Neck: Supple, no JVD, carotid bruits, or masses. Cardiac: RRR, no murmurs, rubs, or gallops. No clubbing, cyanosis, edema.  Radials/PT 2+ and equal bilaterally.  Respiratory:  Respirations regular and unlabored, clear to auscultation bilaterally. GI: Soft, nontender, nondistended. MS: No deformity or atrophy. Skin: Warm and dry, no rash. Neuro:  Strength and sensation are intact. Psych: Normal affect.  Assessment & Plan    CAD - CT 07/06/21 with less than 25% stenosis of the LAD. Stable with no anginal symptoms.  GDMT includes Atorvastatin, Carvedilol, Aspirin.   Heart healthy diet and regular cardiovascular exercise encouraged.    HTN - BP well controlled by home monitoring. Continue current antihypertensive regimen.    Carotid stenosis - 06/2021 bilateral 1-39% stenosis. No  amaurosis fugax. Continue optimal cholesterol control and aspirin. No indication for repeat duplex at this time.  HLD - 08/2021 LDL 122. Has not been taking Atorvastatin regularly. She is hesitant regarding PCSK9i as does not want to add another injection. We discussed putting Atorvastatin next to her Trulicity which she also takes once per week. If she takes for 1 month once per week then increase to two times per week. Repeat FLP/LFT in 3 months. Offered referral to discuss with pharmacist which she politely declines. Prefers to trial taking Atorvastatin regularly.   Disposition: Follow up in 3 months with Dr. Oval Linsey or APP.  Signed, Loel Dubonnet, NP 10/13/2021, 8:32 AM [place]

## 2021-10-13 NOTE — Patient Instructions (Addendum)
Medication Instructions:  Your physician has recommended you make the following change in your medication:   Start: Aspirin EC 81mg  daily   Please take your Atorvastatin (Lipitor) one time per week for one month and then increase to 2 times per week.   *If you need a refill on your cardiac medications before your next appointment, please call your pharmacy*   Lab Work: Please return for Lab work In 3 months for fasting Lipid Panel and CMP. You may come to the...   [facility] (3rd floor) [address] [place], [place]  Open: 8am-Noon and 1pm-4:30pm  Please ring the doorbell on the small table when you exit the elevator and the Lab Tech will come get you  [facility] [address], [place], [place] Open: 8am-1pm, then 2pm-4:30pm   [facility]- Please see attached locations sheet stapled to your lab work with address and hours.   If you have labs (blood work) drawn today and your tests are completely normal, you will receive your results only by: MyChart Message (if you have MyChart) OR A paper copy in the mail If you have any lab test that is abnormal or we need to change your treatment, we will call you to review the results.   Testing/Procedures: EKG looks good today.    Follow-Up: At [facility], you and your health needs are our priority.  As part of our continuing mission to provide you with exceptional heart care, we have created designated Provider Care Teams.  These Care Teams include your primary Cardiologist (physician) and Advanced Practice Providers (APPs -  Physician Assistants and Nurse Practitioners) who all work together to provide you with the care you need, when you need it.  We recommend signing up for the patient portal called "MyChart".  Sign up information is provided on this After Visit Summary.  MyChart is used to connect with patients for Virtual Visits (Telemedicine).   Patients are able to view lab/test results, encounter notes, upcoming appointments, etc.  Non-urgent messages can be sent to your provider as well.   To learn more about what you can do with MyChart, go to [facility].    Your next appointment:   3 month(s)  The format for your next appointment:   In Person  Provider:   [email or web address], MD or Chilton Si, NP{  Other Instructions Heart Healthy Diet Recommendations: A low-salt diet is recommended. Meats should be grilled, baked, or boiled. Avoid fried foods. Focus on lean protein sources like fish or chicken with vegetables and fruits. The American Heart Association is a Gillian [place]!  American Heart Association Diet and Lifeystyle Recommendations   Exercise recommendations: The American Heart Association recommends 150 minutes of moderate intensity exercise weekly. Try 30 minutes of moderate intensity exercise 4-5 times per week. This could include walking, jogging, or swimming.   Important Information About Sugar

## 2021-11-08 ENCOUNTER — Other Ambulatory Visit: Payer: Self-pay | Admitting: Cardiovascular Disease

## 2021-11-09 NOTE — Telephone Encounter (Signed)
Rx request sent to pharmacy.  

## 2021-11-10 DIAGNOSIS — R143 Flatulence: Secondary | ICD-10-CM | POA: Diagnosis not present

## 2021-11-10 DIAGNOSIS — E785 Hyperlipidemia, unspecified: Secondary | ICD-10-CM | POA: Diagnosis not present

## 2021-11-10 DIAGNOSIS — E559 Vitamin D deficiency, unspecified: Secondary | ICD-10-CM | POA: Diagnosis not present

## 2021-11-10 DIAGNOSIS — K219 Gastro-esophageal reflux disease without esophagitis: Secondary | ICD-10-CM | POA: Diagnosis not present

## 2021-11-22 ENCOUNTER — Ambulatory Visit: Payer: Medicare PPO | Admitting: Internal Medicine

## 2021-11-22 ENCOUNTER — Encounter: Payer: Self-pay | Admitting: Internal Medicine

## 2021-11-22 VITALS — BP 140/80 | HR 77 | Ht 62.0 in | Wt 132.8 lb

## 2021-11-22 DIAGNOSIS — R809 Proteinuria, unspecified: Secondary | ICD-10-CM | POA: Diagnosis not present

## 2021-11-22 DIAGNOSIS — E785 Hyperlipidemia, unspecified: Secondary | ICD-10-CM

## 2021-11-22 DIAGNOSIS — E1165 Type 2 diabetes mellitus with hyperglycemia: Secondary | ICD-10-CM | POA: Diagnosis not present

## 2021-11-22 LAB — POCT GLYCOSYLATED HEMOGLOBIN (HGB A1C): Hemoglobin A1C: 8.2 % — AB (ref 4.0–5.6)

## 2021-11-22 MED ORDER — SEMAGLUTIDE(0.25 OR 0.5MG/DOS) 2 MG/3ML ~~LOC~~ SOPN: PEN_INJECTOR | SUBCUTANEOUS | 11 refills | Status: DC

## 2021-11-22 MED ORDER — GLIPIZIDE ER 2.5 MG PO TB24: ORAL_TABLET | ORAL | 3 refills | Status: DC

## 2021-11-22 NOTE — Patient Instructions (Addendum)
Please stop Trulicity and change to: - Ozempic 0.5 mg weekly  Start consistently: - Glipizide XL 2.5 mg 2x a day before meals  Please return in 4 months with your sugar log.

## 2021-11-22 NOTE — Progress Notes (Signed)
Patient ID: Sandra [name], female   DOB: 15-Jun-1945, 76 y.o.   MRN: [id number]   HPI: Sandra [name] is a 76 y.o.-year-old female, returning for f/u for DM2, dx in 2015, non-insulin-dependent, uncontrolled, with long term complications (MAU, bilateral carotid artery stenosis). Last visit 4.5 months ago.  Interim history: No increased urination, blurry vision, nausea, chest pain. She is seen in the Hypertension clinic. Sees Dr. Oval Linsey. Since last visit she had a coronary calcium score of 3.17 (44 percentile) Stopped Prilosec b/c significant gas. She is a grandmother, had her first [place] recently-daughter lives in [place].  She has been spending time there and prefers to go to [place] to visit the other daughter.  Reviewed HbA1c levels: Lab Results  Component Value Date   HGBA1C 7.6 (A) 07/05/2021   HGBA1C 6.9 (A) 03/03/2021   HGBA1C 7.1 (A) 10/28/2020   HGBA1C 6.6 (A) 07/01/2020   HGBA1C 8.5 (A) 03/09/2020   HGBA1C 7.4 (A) 11/04/2019   HGBA1C 8.1 (A) 07/17/2019   HGBA1C 7.0 (A) 03/13/2019   HGBA1C 6.8 (A) 11/11/2018   HGBA1C 7.6 (A) 07/01/2018   HGBA1C 8.2 (A) 02/25/2018   HGBA1C 9.2 (A) 10/12/2017   HGBA1C 7.4 06/13/2017   HGBA1C 7.4 03/13/2017   HGBA1C 7.5 12/11/2016   HGBA1C 7.7 07/10/2016   HGBA1C 7.3 04/10/2016   HGBA1C 7.7 12/15/2015  07/30/2019: HbA1c 8.3% 10/2015: HbA1c 8.2%  Pt is on a regimen of: - Metformin ER 500 mg at bedtime >> stopped 2/2 recall >> restarted 500 mg at bedtime-she could not tolerate higher doses due to diarrhea >> off 2/2 diarrhea - Glipizide XL 2.5 mg daily >> not consistently, only before  - Trulicity 1.5 mg >> 3 mg weekly-no GI side effects She was on Jardiance 10 mg daily in am - started 07/2016 >> could not tolerate it b/c increased urination and hyperglycemia  She was on Metformin 500 mg 1x a day with dinner (500 mg tid >> AP/diarrhea)  >> stopped when started Januvia  Pt checks her sugars 0-1x a day: - am:  137-154, 180 >>  115-140s >> 131-182 >> 134-199 - 2h after b'fast:  170s >> 135-161, 258 >> n/c - before lunch:  125 >> 150s >> 160-180 >> n/c - 2h after lunch:  142 >> n/c >> 125-158 >> 200 >> n/c - before dinner: 128-165 >> n/c >> 196 >> n/c - 2h after dinner: n/c >> 137 >> n/c - bedtime: 150s-160s >> 150s >> 140s-150s >> 138 >> n/c - nighttime: n/c >> 169-208 >> n/c It is unclear at which CBG level she has hypoglycemia awareness. Lowest: 110 >> 134 Highest:  199 >> 180 >> 215 x1 >> 199.  Glucometer: OneTouch >> AccuChek  Her GFR is not decreased, but she has microalbuminuria: 08/24/2021: 23/0.97, GFR 61, ACR 125.5, glucose 186 Lab Results  Component Value Date   BUN 18 06/17/2021   BUN 20 06/02/2020   CREATININE 1.01 (H) 06/17/2021   CREATININE 0.95 06/02/2020   Lab Results  Component Value Date   MICRALBCREAT 5.6 11/11/2018  08/11/2020: 17/0.88, ACR 94.6 07/30/2019: ACR 36.9, BUN/creatinine 19/0.86 07/09/2018: glucose of 173.  BUN/creatinine 13/0.75, GFR 92, ACR 96.3 07/04/2017: 21/0.73, ACR not checked 01/12/2017: Glu 181, 18/0.8, eGFR 86, ACR 42.1 10/22/2015: 21/0.79, ACR 35.6 On Micardis 80.  + HL; last set of lipids: 08/24/2021: 204/216/44/122 Lab Results  Component Value Date   CHOL 183 06/17/2021   HDL 47 06/17/2021   LDLCALC 89 06/17/2021   TRIG 286 (H)  06/17/2021   CHOLHDL 3.9 06/17/2021  08/11/2020: 201/202/42/124 07/30/2019: 208/278/42/118 07/09/2018: 195/250/40/106 07/04/2017: 207/381/40/91 01/12/2017: 199/380/41/82 10/22/2015: 226/201/48/138  Previously on rosuvastatin 5 mg weekly >> then Lipitor 10 mg weekly >> advised her to take it 3 times a week at least. She stopped since last OV >> recently advised to restart at least 1x a week.  - last eye exam was in 09/2021: No DR; has high intraocular pressure.  On eyedrops.  She sees Dr. Prudencio Burly.  She has a history of cataract surgery.  - no numbness and tingling in her feet. Foot exam performed today.  TSH on 08/11/2020: 1.10, on  08/24/2021: TSH 0.81 B12 on 08/11/2020: 261  ROS: + see HPI  I reviewed pt's medications, allergies, PMH, social hx, family hx, and changes were documented in the history of present illness. Otherwise, unchanged from my initial visit note.  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   Cervical pain 12/15/2015   Diverticulitis of colon 12/15/2015   Acid reflux 12/15/2015   Essential (primary) hypertension 12/15/2015   Benign breast lumps 12/15/2015   Knee pain 11/08/2011   Social History   Social History   Marital status: Married    Spouse name: N/A   Number of children: 2   Occupational History   Retired    Social History Main Topics   Smoking status: Never Smoker   Smokeless tobacco: Never Used   Alcohol use Socially    Drug use: No    Current Outpatient Medications on File Prior to Visit  Medication Sig Dispense Refill   amLODipine (NORVASC) 10 MG tablet TAKE 1 TABLET BY MOUTH EVERY DAY 90 tablet 3   aspirin EC 81 MG tablet Take 1 tablet (81 mg total) by mouth daily. Swallow whole. 90 tablet 3   atorvastatin (LIPITOR) 10 MG tablet Take 1 tablet (10 mg total) by mouth as directed. TAKE 1 TABLET ONCE A WEEK 12 tablet 1   Blood Glucose Monitoring Suppl (ONETOUCH VERIO) w/Device KIT Use to check blood sugar daily 1 kit 0   carvedilol (COREG) 12.5 MG tablet TAKE 1 TABLET BY MOUTH TWICE A DAY 180 tablet 2   COVID-19 mRNA vaccine, Moderna, 100 MCG/0.5ML injection Inject into the muscle. 0.25 mL 0   Dulaglutide (TRULICITY) 1.5 TL/5.7WI SOPN Inject 3 mg into the skin once a week. 4 mL 11   glipiZIDE (GLUCOTROL XL) 2.5 MG 24 hr tablet TAKE 1 TABLET (2.5 MG TOTAL) BY MOUTH 2  (TWO) TIMES DAILY BEFORE A MEAL. 90 tablet 3   glucose blood (ONETOUCH VERIO) test strip CHECK BLOOD SUGAR IN THE MORNING PRIOR TO BREAKFAST 100 strip 4   Insulin Pen Needle 32G X 4 MM MISC Use to inject insulin weekly 100 each 5   ivabradine (CORLANOR) 5 MG TABS tablet TAKE 1 TABLET 2 HOURS PRIOR TO CT 1 tablet 0   Lancets (ONETOUCH DELICA PLUS [id number]) MISC USE TO CHECK BLOOD SUGAR TWO TIMES A DAY. 100 each 2   latanoprost (XALATAN) 0.005 % ophthalmic solution Place 1 drop into both eyes at bedtime.     metoprolol tartrate (LOPRESSOR) 100 MG tablet TAKE 1 TABLET 2 HOURS PRIOR TO CT 1 tablet 0  MICARDIS HCT 80-25 MG per tablet Take 1 tablet by mouth daily.     Multiple Vitamins-Minerals (WOMENS MULTIVITAMIN PO) Take by mouth daily.     omeprazole (PRILOSEC) 20 MG capsule Take 20 mg by mouth as needed (HEARTBURN).     No current facility-administered medications on file prior to visit.    Allergies  Allergen Reactions   Metformin Hcl Er Other (See Comments)   Sulfa Antibiotics Rash   Family History  Problem Relation Age of Onset   Peripheral Artery Disease Sister    Stroke Brother    Diabetes Brother    Heart attack Brother    Lung cancer Sister    Stroke Brother    CAD Brother    Heart disease Sister    PE: BP 140/80 (BP Location: Right Arm, Patient Position: Sitting, Cuff Size: Normal)   Pulse 77   Ht '5\' 2"'  (1.575 m)   Wt 132 lb 12.8 oz (60.2 kg)   SpO2 99%   BMI 24.29 kg/m  Wt Readings from Last 3 Encounters:  11/22/21 132 lb 12.8 oz (60.2 kg)  10/13/21 132 lb 6.4 oz (60.1 kg)  07/05/21 131 lb 6.4 oz (59.6 kg)   Constitutional: normal weight, in NAD Eyes: PERRLA, EOMI, no exophthalmos ENT: moist mucous membranes, no thyromegaly, no cervical lymphadenopathy Cardiovascular: RRR, No MRG Respiratory: CTA B Musculoskeletal: no deformities Skin: moist, warm, no rashes Neurological: no tremor with outstretched hands  ASSESSMENT: 1. DM2, non-insulin-dependent,  uncontrolled, with with complications -Bilateral carotid artery stenosis  2. Microalbuminuria  3. HL  PLAN:  1. Patient with longstanding, uncontrolled, type 2 diabetes, with improved control after completing a 12-week exercise and nutrition program at the [facility]  HbA1c decreased to 6.6% afterwards but then started to increase.  At last visit, it was 7.6%.  At that time, sugars are higher, above target in the morning.  She was not checking later in the day except for one value, at bedtime, which was at goal.  She mentions that her blood sugar started to increase after she was forced to back off the Trulicity dose from 3 to 1.5 mg weekly due to lack of availability at the pharmacy.  However, right before the visit she was able to fill the prescription for the 3 mg dose.  We started this back.  We did not change the rest of the regimen. -At this visit, she stopped taking metformin due to GI symptoms.  Also, she is taking glipizide inconsistently, only when she is having larger meals.  Moreover, she is not checking blood sugars consistently and not checking later in the day.  She is very bothered by gas and will see GI for this.  We discussed that her GLP-1 receptor agonist can be the culprit.  She is very reticent to stop this.  We discussed about backing off the dose but she would not want to do so.  In fact, I suggested to basal insulin, but she would like to hold off for now.  She is wondering whether she could try Ozempic.  We could try 0.5 mg weekly, but we discussed that the side effects are fairly similar.  For now, per her preference, we will try this and I also advised her to add back glipizide twice a day.  However, after she sees GI, advised her to let me know if she needs to come off the GLP-1 receptor agonist.  In that case, we definitely need insulin. - I suggested to:   Patient  Instructions  Please stop Trulicity and change to: - Ozempic 0.5 mg weekly  Start consistently: - Glipizide XL  2.5 mg 2x a day before meals  Please return in 4 months with your sugar log.   - we checked her HbA1c: 8.2% (higher) - advised to check sugars at different times of the day - 1-2x a day, rotating check times - advised for yearly eye exams >> she is UTD - return to clinic in 4 months  2. Microalbuminuria -Likely related to diabetes -She continues on Micardis 80 mg daily -Latest level was high, at 125.5 in 08/2021  3. HL -Reviewed latest lipid panel from 08/2021: LDL above our target of less than 55 due to cardiovascular disease, triglycerides also high: 204/216/40/122 -On Lipitor 10 mg once a week due to intolerance.  She is preparing to increase the dose.  Philemon Kingdom, MD PhD [facility]

## 2022-02-06 DIAGNOSIS — K219 Gastro-esophageal reflux disease without esophagitis: Secondary | ICD-10-CM | POA: Diagnosis not present

## 2022-02-06 DIAGNOSIS — R143 Flatulence: Secondary | ICD-10-CM | POA: Diagnosis not present

## 2022-02-06 DIAGNOSIS — H401132 Primary open-angle glaucoma, bilateral, moderate stage: Secondary | ICD-10-CM | POA: Diagnosis not present

## 2022-02-06 DIAGNOSIS — E1165 Type 2 diabetes mellitus with hyperglycemia: Secondary | ICD-10-CM | POA: Diagnosis not present

## 2022-02-06 DIAGNOSIS — E1169 Type 2 diabetes mellitus with other specified complication: Secondary | ICD-10-CM | POA: Diagnosis not present

## 2022-02-06 DIAGNOSIS — E789 Disorder of lipoprotein metabolism, unspecified: Secondary | ICD-10-CM | POA: Diagnosis not present

## 2022-02-14 ENCOUNTER — Ambulatory Visit (HOSPITAL_BASED_OUTPATIENT_CLINIC_OR_DEPARTMENT_OTHER): Payer: Medicare PPO | Admitting: Cardiovascular Disease

## 2022-02-14 ENCOUNTER — Encounter (HOSPITAL_BASED_OUTPATIENT_CLINIC_OR_DEPARTMENT_OTHER): Payer: Self-pay | Admitting: Cardiovascular Disease

## 2022-02-14 DIAGNOSIS — I6523 Occlusion and stenosis of bilateral carotid arteries: Secondary | ICD-10-CM

## 2022-02-14 DIAGNOSIS — E7800 Pure hypercholesterolemia, unspecified: Secondary | ICD-10-CM

## 2022-02-14 DIAGNOSIS — I10 Essential (primary) hypertension: Secondary | ICD-10-CM

## 2022-02-14 DIAGNOSIS — I2510 Atherosclerotic heart disease of native coronary artery without angina pectoris: Secondary | ICD-10-CM

## 2022-02-14 HISTORY — DX: Atherosclerotic heart disease of native coronary artery without angina pectoris: I25.10

## 2022-02-14 NOTE — Patient Instructions (Addendum)
Medication Instructions:  STOP ASPIRIN   *If you need a refill on your cardiac medications before your next appointment, please call your pharmacy*  Lab Work: NONE  Testing/Procedures: NONE  Follow-Up: At [facility], you and your health needs are our priority.  As part of our continuing mission to provide you with exceptional heart care, we have created designated Provider Care Teams.  These Care Teams include your primary Cardiologist (physician) and Advanced Practice Providers (APPs -  Physician Assistants and Nurse Practitioners) who all work together to provide you with the care you need, when you need it.  We recommend signing up for the patient portal called "MyChart".  Sign up information is provided on this After Visit Summary.  MyChart is used to connect with patients for Virtual Visits (Telemedicine).  Patients are able to view lab/test results, encounter notes, upcoming appointments, etc.  Non-urgent messages can be sent to your provider as well.   To learn more about what you can do with MyChart, go to [email or web address].    Your next appointment:   12 month(s)  The format for your next appointment:   In Person  Provider:   Skeet Latch, MD

## 2022-02-14 NOTE — Assessment & Plan Note (Signed)
BP is well-controlled.  She is doing well.  Encouraged her to get back on her exercise routine.  Continue amlodipine, telmisartan and HCTZ.

## 2022-02-14 NOTE — Assessment & Plan Note (Signed)
Atorvastatin was recently increased.  Dr. Louis Matte will re-check in a couple months.  LDL goal <70.

## 2022-02-14 NOTE — Assessment & Plan Note (Signed)
Minimal plaque on coronary CT-A.  44th percentile for age and gender.  We will stop aspirin given her GI bleeding history. Agree with increasing the atorvastatin.  LDL goal is <70.  Work on increasing exercise.

## 2022-02-14 NOTE — Assessment & Plan Note (Signed)
Minimal plaqe.  Stopping aspirin and continue statin as above.

## 2022-02-14 NOTE — Progress Notes (Signed)
Cardiology Follow Up:    Date:  02/14/2022   ID:  Sandra [name], DOB 10/03/1945, MRN [id number]  PCP:  Thana Ates, MD  Cardiologist:  Chilton Si, MD  Nephrologist:  Referring MD: Marden Noble, MD   CC: Hypertension  History of Present Illness:    Sandra [name] is a 76 y.o. female with a hx of diabetes, hypertension, mild carotid stenosis, and hyperlipidemia here for follow-up.  She initially established care 01/2020 in the advanced hypertension clinic. She has been on her same BP medication for 25 years.  Her blood pressure was pretty stable.  However she saw Dr. Kevan Ny because she started experiencing tinnitus.  When he saw her her blood pressure was in the 150s over 90s.  She had a screening a couple weeks prior and her blood pressure was elevated then as well.  She was started on amlodipine 5 mg.  Her blood pressure remained elevated so a few days ago metoprolol 25 mg was added.  At her initial ADV HTN appointment metoprolol was discontinued and amlodipine was increased.  She was also enrolled in the PREP program through the [facility]. She noted improvement in her BP.  She followed up with Gillian Shields, NP on 11/2020 and was doing well.   At her visit on 04/2021 her BP had been well-controlled. It was higher than usual because of family stressors. We discussed working on diet and exercise and a reassessment of her blood pressure remained elevated. She was referred to Pharmacy for PCSK9 inhibitor. She had carotid dopplers 06/2021 that showed mild bilateral stenosis.   At her last visit, her blood pressures were higher in the office but slightly elevated at home. She planned to start back with her exercise. She noted that she wasn't taking her atorvastatin consistently. She had atypical chest pain that was slightly worse with exertion. Coronary CTA 07/2021 showed minimal plaque and aortic atherosclerosis. She followed-up with Gillian Shields, NP and was doing well. Today, she states  she has been feeling good. At home her blood pressures are usually well controlled, ranging 120s-130s in the mornings (lowest 115 systolic). She is compliant with her antihypertensives. Currently, she is taking atorvastatin 10 mg once a week. Due to an adverse reaction to Trulicity, she is now on 0.5 mg Ozempic. Mostly she would attribute her higher A1c to a lack of activity and formal exercise. She plans to work on increasing her exercise. Of note, she has a new PCP, Dr. Margaretann Loveless, since Dr. Kevan Ny has retired. She denies any palpitations, chest pain, shortness of breath, or peripheral edema. No lightheadedness, headaches, syncope, orthopnea, or PND.  Past Medical History:  Diagnosis Date   Atypical chest pain 06/23/2021   CAD in native artery 02/14/2022   Pure hypercholesterolemia 01/22/2020    History reviewed. No pertinent surgical history.  Current Medications: No outpatient medications have been marked as taking for the 02/14/22 encounter (Office Visit) with Chilton Si, MD.     Allergies:   Metformin hcl er and Sulfa antibiotics   Social History   Socioeconomic History   Marital status: Married    Spouse name: Not on file   Number of children: Not on file   Years of education: Not on file   Highest education level: Not on file  Occupational History   Not on file  Tobacco Use   Smoking status: Never   Smokeless tobacco: Never  Substance and Sexual Activity   Alcohol use: Not on file   Drug  use: Not on file   Sexual activity: Not on file  Other Topics Concern   Not on file  Social History Narrative   Not on file   Social Determinants of Health   Financial Resource Strain: Not on file  Food Insecurity: Not on file  Transportation Needs: Not on file  Physical Activity: Not on file  Stress: Not on file  Social Connections: Not on file     Family History: The patient's family history includes CAD in her brother; Diabetes in her brother; Heart attack in her brother;  Heart disease in her sister; Lung cancer in her sister; Peripheral Artery Disease in her sister; Stroke in her brother and brother.  ROS:   Please see the history of present illness.    All other systems reviewed and are negative.  EKGs/Labs/Other Studies Reviewed:    Coronary CTA  07/06/2021: IMPRESSION: 1. Coronary calcium score of 3.17. This was 44th percentile for age-, race-, and sex-matched controls.   2.  Normal coronary origin with right dominance.   3.  Minimal (<25%) calcified plaque in the LCX.  CAD-RADS 1.   4.  Aortic atherosclerosis.  Bilateral Carotid Dopplers 06/21/2021: Summary:  Right Carotid: Velocities in the right ICA are consistent with a 1-39%  stenosis.   Left Carotid: Velocities in the left ICA are consistent with a 1-39%  stenosis.   Vertebrals:  Bilateral vertebral arteries demonstrate antegrade flow.  Subclavians: Normal flow hemodynamics were seen in bilateral subclavian arteries.   EKG:  EKG is personally reviewed.  02/14/2022:  EKG was not ordered. 06/23/2021: EKG was not ordered. 01/22/20: sinus rhythm.  Rate 76 bpm.    Recent Labs: 06/17/2021: ALT 17; BUN 18; Creatinine, Ser 1.01; Potassium 4.1; Sodium 140   Recent Lipid Panel    Component Value Date/Time   CHOL 183 06/17/2021 0921   TRIG 286 (H) 06/17/2021 0921   HDL 47 06/17/2021 0921   CHOLHDL 3.9 06/17/2021 0921   LDLCALC 89 06/17/2021 0921    Physical Exam:    VS:  There were no vitals taken for this visit. , BMI There is no height or weight on file to calculate BMI. GENERAL:  Well appearing HEENT: Pupils equal round and reactive, fundi not visualized, oral mucosa unremarkable NECK:  No jugular venous distention, waveform within normal limits, carotid upstroke brisk and symmetric, no bruits, no thyromegaly LUNGS:  Clear to auscultation bilaterally HEART:  RRR.  PMI not displaced or sustained,S1 and S2 within normal limits, no S3, no S4, no clicks, no rubs, no murmurs ABD:  Flat,  positive bowel sounds normal in frequency in pitch, no bruits, no rebound, no guarding, no midline pulsatile mass, no hepatomegaly, no splenomegaly EXT:  2 plus pulses throughout, no edema, no cyanosis no clubbing SKIN:  No rashes no nodules NEURO:  Cranial nerves II through XII grossly intact, motor grossly intact throughout PSYCH:  Cognitively intact, oriented to person place and time   ASSESSMENT:    1. Essential (primary) hypertension   2. CAD in native artery   3. Bilateral carotid artery stenosis   4. Pure hypercholesterolemia    PLAN:    Essential (primary) hypertension BP is well-controlled.  She is doing well.  Encouraged her to get back on her exercise routine.  Continue amlodipine, telmisartan and HCTZ.    CAD in native artery Minimal plaque on coronary CT-A.  44th percentile for age and gender.  We will stop aspirin given her GI bleeding history. Agree with increasing  the atorvastatin.  LDL goal is <70.  Work on increasing exercise.     Carotid stenosis Minimal plaqe.  Stopping aspirin and continue statin as above.   Pure hypercholesterolemia Atorvastatin was recently increased.  Dr. Margaretann Loveless will re-check in a couple months.  LDL goal <70.   Disposition:    Follow up with Kenetha Cozza C. Duke Salvia, MD, [facility] in 1 year.  Medication Adjustments/Labs and Tests Ordered: Current medicines are reviewed at length with the patient today.  Concerns regarding medicines are outlined above.   No orders of the defined types were placed in this encounter.  No orders of the defined types were placed in this encounter.  I,Mathew Stumpf,acting as a Neurosurgeon for Chilton Si, MD.,have documented all relevant documentation on the behalf of Chilton Si, MD,as directed by  Chilton Si, MD while in the presence of Chilton Si, MD.  I, Saron Vanorman C. Duke Salvia, MD have reviewed all documentation for this visit.  The documentation of the exam, diagnosis, procedures, and orders on  02/14/2022 are all accurate and complete.  Guadalupe Maple  02/14/2022 8:32 AM    [facility]

## 2022-03-14 ENCOUNTER — Ambulatory Visit: Payer: Medicare PPO | Admitting: Internal Medicine

## 2022-03-14 ENCOUNTER — Encounter: Payer: Self-pay | Admitting: Internal Medicine

## 2022-03-14 VITALS — BP 130/82 | HR 84 | Ht 62.0 in | Wt 130.8 lb

## 2022-03-14 DIAGNOSIS — R809 Proteinuria, unspecified: Secondary | ICD-10-CM | POA: Diagnosis not present

## 2022-03-14 DIAGNOSIS — E1165 Type 2 diabetes mellitus with hyperglycemia: Secondary | ICD-10-CM | POA: Diagnosis not present

## 2022-03-14 DIAGNOSIS — E785 Hyperlipidemia, unspecified: Secondary | ICD-10-CM | POA: Diagnosis not present

## 2022-03-14 LAB — POCT GLYCOSYLATED HEMOGLOBIN (HGB A1C): Hemoglobin A1C: 6.9 % — AB (ref 4.0–5.6)

## 2022-03-14 MED ORDER — SEMAGLUTIDE(0.25 OR 0.5MG/DOS) 2 MG/3ML ~~LOC~~ SOPN: PEN_INJECTOR | SUBCUTANEOUS | 3 refills | Status: DC

## 2022-03-14 NOTE — Patient Instructions (Signed)
Please continue: - Ozempic 0.5 mg weekly - Glipizide XL 2.5 mg 2x a day before meals  Please return in 4 months with your sugar log.

## 2022-03-14 NOTE — Progress Notes (Signed)
Follow-up patient ID: Sandra [name], female   DOB: [birth year], 76 y.o.   MRN: [id number]   HPI: Sandra [name] is a 77 y.o.-year-old female, returning for f/u for DM2, dx in 2015, non-insulin-dependent, uncontrolled, with long term complications (MAU, bilateral carotid artery stenosis). Last visit 4.5 months ago. New PCP at [facility] - Dr. Louis Matte.  Interim history: No increased urination, blurry vision, nausea, chest pain. She is seen in the Hypertension clinic. Sees Dr. Oval Linsey. She restarted exercise 3 weeks ago - walking 2 miles a day and also joined a gym.  She is thinking about working with a Physiological scientist. Since last visit she also started to work on her diet.  She has earlier meals and reduced portions.  Also, she eliminated sweet drinks.  Reviewed HbA1c levels: Lab Results  Component Value Date   HGBA1C 8.2 (A) 11/22/2021   HGBA1C 7.6 (A) 07/05/2021   HGBA1C 6.9 (A) 03/03/2021   HGBA1C 7.1 (A) 10/28/2020   HGBA1C 6.6 (A) 07/01/2020   HGBA1C 8.5 (A) 03/09/2020   HGBA1C 7.4 (A) 11/04/2019   HGBA1C 8.1 (A) 07/17/2019   HGBA1C 7.0 (A) 03/13/2019   HGBA1C 6.8 (A) 11/11/2018   HGBA1C 7.6 (A) 07/01/2018   HGBA1C 8.2 (A) 02/25/2018   HGBA1C 9.2 (A) 10/12/2017   HGBA1C 7.4 06/13/2017   HGBA1C 7.4 03/13/2017   HGBA1C 7.5 12/11/2016   HGBA1C 7.7 07/10/2016   HGBA1C 7.3 04/10/2016   HGBA1C 7.7 12/15/2015  07/30/2019: HbA1c 8.3% 10/2015: HbA1c 8.2%  Pt is on a regimen of: - Glipizide XL 2.5 mg daily >> inconsistently  >> 2x a day - Trulicity 1.5 mg >> 3 mg weekly >> Ozempic 0.5 mg weekly - gas and reflux resolved She was on Jardiance 10 mg daily in am - started 07/2016 >> could not tolerate it b/c increased urination and hyperglycemia  She was on Metformin 500 mg 1x a day with dinner (500 mg tid >> AP/diarrhea)  >> stopped when started Januvia She was then on metformin ER which she could not tolerate due to diarrhea.  Pt checks her sugars 0-1x a day: - am: 131-182 >>  134-199 >> 165-186, but 93-178 in last 2 weeks - 2h after b'fast:  170s >> 135-161, 258 >> n/c - before lunch:  125 >> 150s >> 160-180 >> n/c - 2h after lunch:  142 >> n/c >> 125-158 >> 200 >> n/c - before dinner: 128-165 >> n/c >> 196 >> n/c - 2h after dinner: n/c >> 137 >> n/c - bedtime: 1150s >> 140s-150s >> 138 >> n/c >> 119-129 - nighttime: n/c >> 169-208 >> n/c  It is unclear at which CBG level she has hypoglycemia awareness. Lowest: 110 >> 134 >> 93 Highest: 215 x1 >> 199 >> 226.  Glucometer: OneTouch >> AccuChek  Her GFR is not decreased, but she has microalbuminuria: 02/06/2022: 16/0.83, GFR 74, glucose 176 08/24/2021: 23/0.97, GFR 61, ACR 125.5, glucose 186 Lab Results  Component Value Date   BUN 18 06/17/2021   BUN 20 06/02/2020   CREATININE 1.01 (H) 06/17/2021   CREATININE 0.95 06/02/2020   Lab Results  Component Value Date   MICRALBCREAT 5.6 11/11/2018  08/11/2020: 17/0.88, ACR 94.6 07/30/2019: ACR 36.9, BUN/creatinine 19/0.86 07/09/2018: glucose of 173.  BUN/creatinine 13/0.75, GFR 92, ACR 96.3 07/04/2017: 21/0.73, ACR not checked 01/12/2017: Glu 181, 18/0.8, eGFR 86, ACR 42.1 10/22/2015: 21/0.79, ACR 35.6 On Micardis 80.  + HL; last set of lipids: 02/06/2022: 223/130/52/147 08/24/2021: 204/216/44/122 Lab Results  Component Value  Date   CHOL 183 06/17/2021   HDL 47 06/17/2021   LDLCALC 89 06/17/2021   TRIG 286 (H) 06/17/2021   CHOLHDL 3.9 06/17/2021  08/11/2020: 201/202/42/124 07/30/2019: 208/278/42/118 07/09/2018: 195/250/40/106 07/04/2017: 207/381/40/91 01/12/2017: 199/380/41/82 10/22/2015: 226/201/48/138  Previously on rosuvastatin 5 mg weekly >> then Lipitor 10 mg weekly >> advised her to take it 3 times a week at least. She stopped since last OV >> restarted at least 1x a week >> now increased to daily  - last eye exam was in 03/2022: No DR reportedly; has high intraocular pressure - VF normal.  On eyedrops.  She sees Dr. Prudencio Burly.  She has a history of cataract  surgery.  - no numbness and tingling in her feet. Foot exam performed 11/23/2021.  TSH on 08/11/2020: 1.10, on 08/24/2021: TSH 0.81 B12 on 08/11/2020: 261  ROS: + see HPI  I reviewed pt's medications, allergies, PMH, social hx, family hx, and changes were documented in the history of present illness. Otherwise, unchanged from my initial visit note.  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   Cervical pain 12/15/2015   Diverticulitis of colon 12/15/2015   Acid reflux 12/15/2015   Essential (primary) hypertension 12/15/2015   Benign breast lumps 12/15/2015   Knee pain 11/08/2011   Social History   Social History   Marital status: Married    Spouse name: N/A   Number of children: 2   Occupational History   Retired    Social History Main Topics   Smoking status: Never Smoker   Smokeless tobacco: Never Used   Alcohol use Socially    Drug use: No    Current Outpatient Medications on File Prior to Visit  Medication Sig Dispense Refill   amLODipine (NORVASC) 10 MG tablet TAKE 1 TABLET BY MOUTH EVERY DAY 90 tablet 3   atorvastatin (LIPITOR) 10 MG tablet Take 10 mg by mouth daily.     Blood Glucose Monitoring Suppl (ONETOUCH VERIO) w/Device KIT Use to check blood sugar daily 1 kit 0   carvedilol (COREG) 12.5 MG tablet TAKE 1 TABLET BY MOUTH TWICE A DAY 180 tablet 2   COVID-19 mRNA vaccine, Moderna, 100 MCG/0.5ML injection Inject into the muscle. 0.25 mL 0   glipiZIDE (GLUCOTROL XL) 2.5 MG 24 hr tablet TAKE 1 TABLET (2.5 MG TOTAL) BY MOUTH 2 (TWO) TIMES DAILY BEFORE A MEAL. 180 tablet 3   glucose blood (ONETOUCH VERIO) test strip CHECK  BLOOD SUGAR IN THE MORNING PRIOR TO BREAKFAST 100 strip 4   Insulin Pen Needle 32G X 4 MM MISC Use to inject insulin weekly 100 each 5   Lancets (ONETOUCH DELICA PLUS [id number]) MISC USE TO CHECK BLOOD SUGAR TWO TIMES A DAY. 100 each 2   latanoprost (XALATAN) 0.005 % ophthalmic solution Place 1 drop into both eyes at bedtime.     MICARDIS HCT 80-25 MG per tablet Take 1 tablet by mouth daily.     Multiple Vitamins-Minerals (WOMENS MULTIVITAMIN PO) Take by mouth daily.     Semaglutide,0.25 or 0.5MG/DOS, 2 MG/3ML SOPN Inject 0.5 mg under skin weekly 3 mL 11   No current facility-administered medications on file prior to visit.  Allergies  Allergen Reactions   Metformin Hcl Er Other (See Comments)   Sulfa Antibiotics Rash   Family History  Problem Relation Age of Onset   Peripheral Artery Disease Sister    Stroke Brother    Diabetes Brother    Heart attack Brother    Lung cancer Sister    Stroke Brother    CAD Brother    Heart disease Sister    PE: BP 130/82 (BP Location: Left Arm, Patient Position: Sitting, Cuff Size: Normal)   Pulse 84   Ht 5' 2" (1.575 m)   Wt 130 lb 12.8 oz (59.3 kg)   SpO2 97%   BMI 23.92 kg/m  Wt Readings from Last 3 Encounters:  03/14/22 130 lb 12.8 oz (59.3 kg)  02/14/22 134 lb 11.2 oz (61.1 kg)  11/22/21 132 lb 12.8 oz (60.2 kg)   Constitutional: normal weight, in NAD Eyes:EOMI, no exophthalmos ENT: no thyromegaly, no cervical lymphadenopathy Cardiovascular: RRR, No MRG Respiratory: CTA B Musculoskeletal: no deformities Skin: moist, warm, no rashes Neurological: no tremor with outstretched hands  ASSESSMENT: 1. DM2, non-insulin-dependent, uncontrolled, with with complications -Bilateral carotid artery stenosis  2. Microalbuminuria  3. HL  PLAN:  1. Patient with longstanding, uncontrolled, type 2 diabetes, with improved control after completing a 12-week exercise and nutrition program at [facility] in the past.  HbA1c decreased to 6.6%  afterwards but then started to increase.  At last visit, she was off metformin due to GI symptoms.  Also, she was taking glipizide inconsistently, only with larger meals.  Moreover, she was not checking blood sugars consistently and not checking later in the day.  She was very bothered by bloating and gas and was preparing to see GI for this.  We discussed that her GLP-1 receptor agonist (Trulicity) could be the culprit.  She was very reticent to stop this.  We discussed about backing off the dose but she declined.  I suggested to increase glipizide to twice a day before meals.  I suggested to basal insulin but she wanted to hold off at that time.  She wanted to try Ozempic and I recommended a 0.5 mg dose.  She was able to start this since last visit.  She tolerates it well and her GERD and bloating has resolved. -HbA1c at last visit was 8.2%, higher. -At today's visit, she noticed that her sugars improved after switching to Ozempic and taking glipizide consistently.  She is also doing a better job with exercise and diet.  She only started to exercise consistently approximately 3 weeks ago, but blood sugars improved significantly afterwards.  In the last few days, sugars are at goal, but approximately 2 weeks ago, she still had blood sugars in the 180s and 200s in the morning.  At today's visit, I strongly advised her to continue - no changes are needed in her regimen - I suggested to:  Patient Instructions  Please continue: - Ozempic 0.5 mg weekly - Glipizide XL 2.5 mg 2x a day before meals  Please return in 4 months with your sugar log.   - we checked her HbA1c: 6.9% (lower) - advised to check sugars at different times of the day - 2x a day, rotating check times - advised for yearly eye exams >> she is UTD - return to clinic in 4 months  2. Microalbuminuria -Likely related to diabetes -Continues on my Cardis 80 mg daily -Latest level was high, at 125.5 in 08/2021  3. HL -Reviewed latest lipid  panel  from 02/2022: LDL above our target of less than 55 due to cardiovascular disease, triglycerides much improved: 02/06/2022: 223/130/52/147 -At last visit she was on Lipitor 10 mg once a week due to intolerance.  We discussed about possibly increasing the dose >> PCP recently increased  it to daily.  She has follow-up lipid panel coming up by PCP.  Philemon Kingdom, MD PhD [facility] End[facility]

## 2022-05-07 IMAGING — CT CT HEART MORP W/ CTA COR W/ SCORE W/ CA W/CM &/OR W/O CM
4 of 8 series · 8 of 20 positions shown, 9 images · IV contrast (APPLIED)
Comparison: None.
COMPARISON: None.

Addendum:
EXAM:
OVER-READ INTERPRETATION  CT CHEST

The following report is an over-read performed by radiologist Dr. MIKESON VI
Abelone [REDACTED] on 07/06/2021. This over-read
does not include interpretation of cardiac or coronary anatomy or
pathology. The coronary CTA interpretation by the cardiologist is
attached.
CLINICAL DATA: 75F with hypertension, diabetes, hyperlipidemia and
atypical chest pain.
Cardiac/Coronary  CT
TECHNIQUE: The patient was scanned on a Phillips Force scanner.

[Series 6: ts syst sharp · axial · 0.31mm/px · z∈[-40,-9]mm · 2 of 234 slices shown]
[im 78/234  lung]
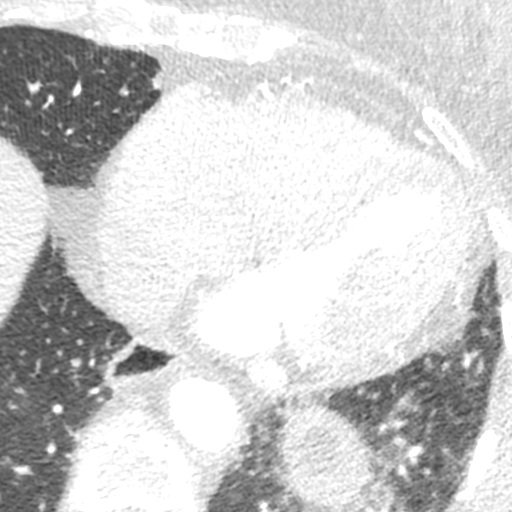
[im 156/234  lung]
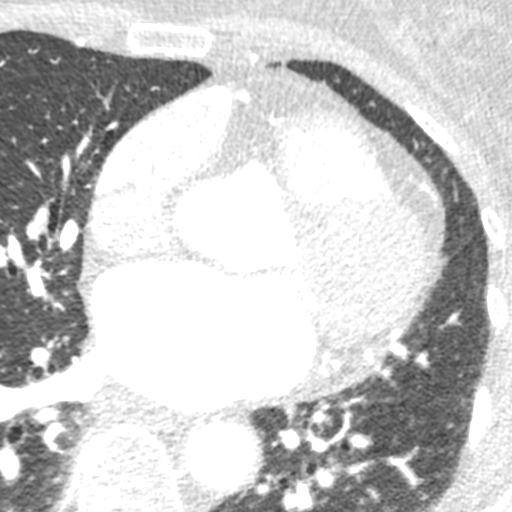

[Series 7: best syst · axial · 0.31mm/px · z∈[-40,-9]mm · 2 of 234 slices shown, 3 images]
[im 78/234  vessel]
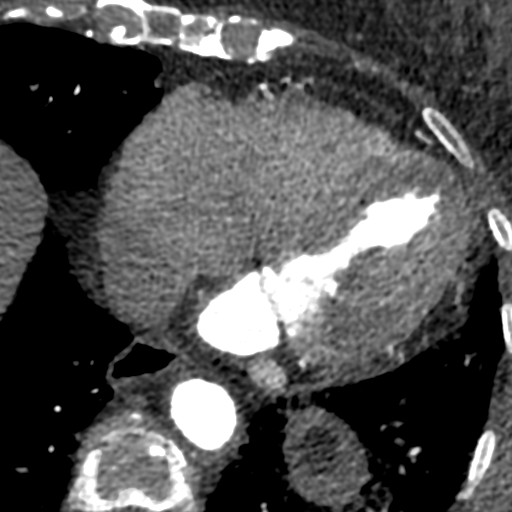
[im 78/234  lung]
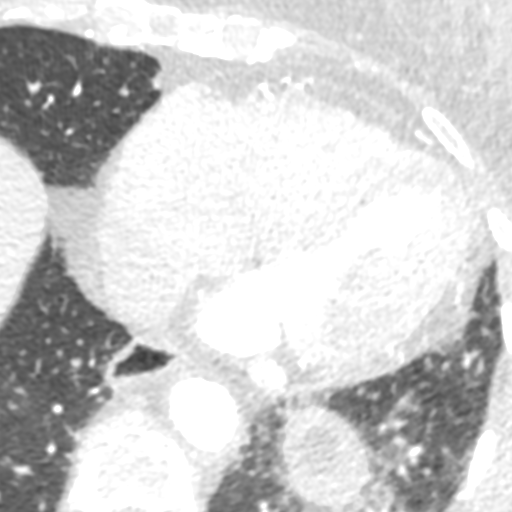
[im 156/234  vessel]
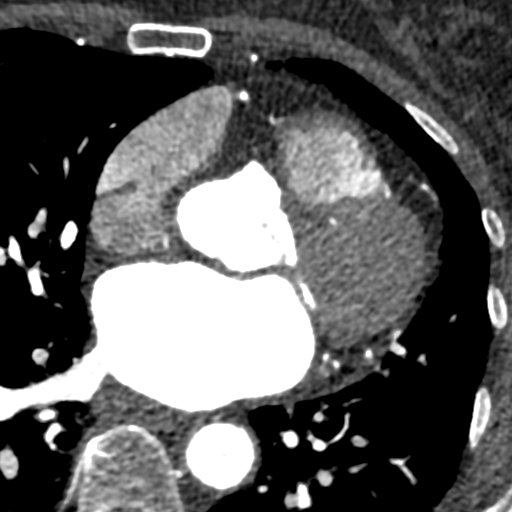

[Series 8: ts diast sharp · axial · 0.31mm/px · z∈[-40,-9]mm · 2 of 234 slices shown]
[im 78/234  lung]
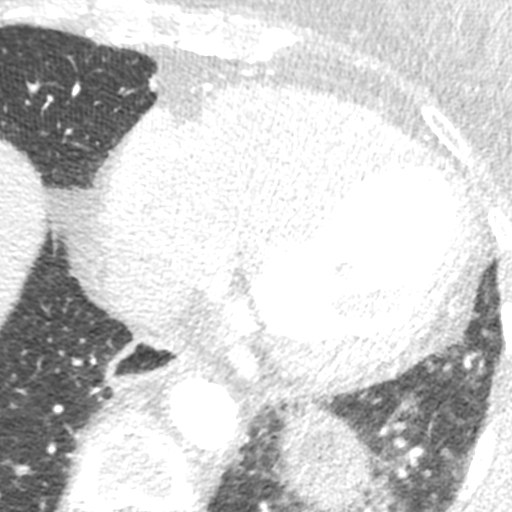
[im 156/234  lung]
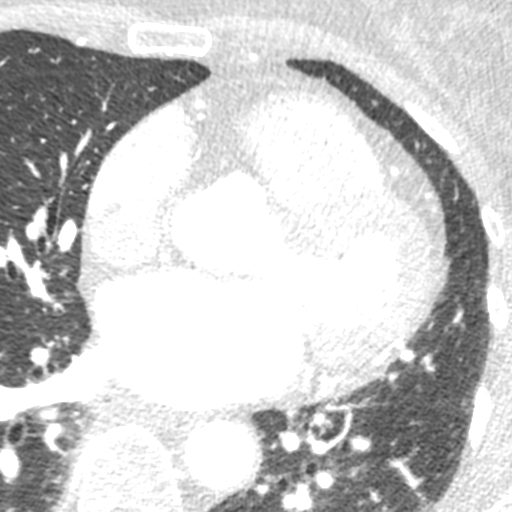

[Series 9: best diast · axial · 0.31mm/px · z∈[-40,-9]mm · 2 of 234 slices shown]
[im 78/234  vessel]
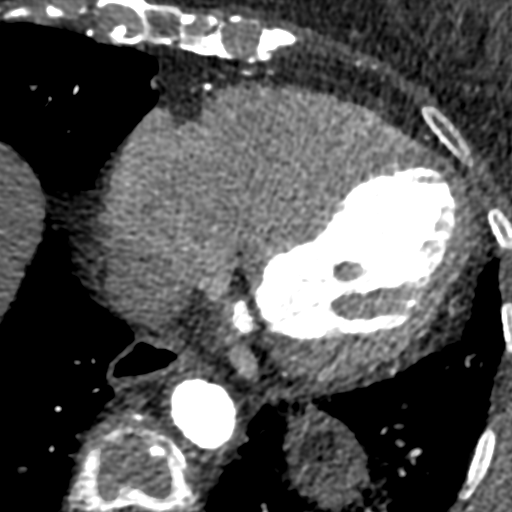
[im 156/234  vessel]
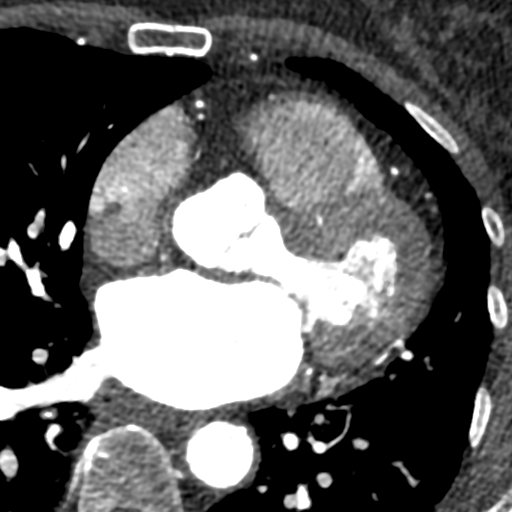

[8 of 20 positions shown; findings below may reference images not displayed]

FINDINGS: Vascular: Aortic and coronary artery calcifications are noted. No
aortic aneurysm or dissection.

Mediastinum/Nodes: No mediastinal or hilar mass or adenopathy
visualized. A small hiatal hernia is noted.

Lungs/Pleura: No worrisome pulmonary lesions or pulmonary nodules.

Upper Abdomen: The visualized upper abdomen is grossly normal.

Musculoskeletal: No breast masses.  No worrisome bone lesions.
IMPRESSION: 1. Small hiatal hernia.
2. Aortic and coronary artery calcifications.

Aortic Atherosclerosis (17AKD-CIJ.J).
FINDINGS: A 120 kV prospective scan was triggered in the descending thoracic
aorta at 111 HU's. Axial non-contrast 3 mm slices were carried out
through the heart. The data set was analyzed on a dedicated work
station and scored using the Agatson method. Gantry rotation speed
was 250 msecs and collimation was .6 mm. No beta blockade and 0.8 mg
of sl NTG was given. The 3D data set was reconstructed in 5%
intervals of the 67-82 % of the R-R cycle. Diastolic phases were
analyzed on a dedicated work station using MPR, MIP and VRT modes.
The patient received 80 cc of contrast.

Aorta: Normal size.  No calcifications.  No dissection.

Aortic Valve:  Trileaflet.  Aortic atherosclerosis.

Coronary Arteries:  Normal coronary origin.  Right dominance.

RCA is a large dominant artery that gives rise to PDA and PLVB.
There is no plaque.

Left main is a large artery that gives rise to LAD and LCX arteries.

LAD is a large vessel that has no plaque. There are two diagonal
vessels without plaque.

LCX is a non-dominant artery that gives rise to two OM branches.
There is minimal (<25%) calcified plaque in the proximal and mid
LCX.

Coronary Calcium Score:

Left main: 0

Left anterior descending artery: 0

Left circumflex artery:

Right coronary artery: 0

Total:

Percentile: 44th

Other findings:

Normal pulmonary vein drainage into the left atrium.

Normal let atrial appendage without a thrombus.

Normal size of the pulmonary artery.
IMPRESSION: 1. Coronary calcium score of 3.17. This was 44th percentile for
age-, race-, and sex-matched controls.

2.  Normal coronary origin with right dominance.

3.  Minimal (<25%) calcified plaque in the LCX.  CAD-RADS 1.

4.  Aortic atherosclerosis.

*** End of Addendum ***
EXAM:
OVER-READ INTERPRETATION  CT CHEST

The following report is an over-read performed by radiologist Dr. MIKESON VI
Abelone [REDACTED] on 07/06/2021. This over-read
does not include interpretation of cardiac or coronary anatomy or
pathology. The coronary CTA interpretation by the cardiologist is
attached.
FINDINGS: Vascular: Aortic and coronary artery calcifications are noted. No
aortic aneurysm or dissection.

Mediastinum/Nodes: No mediastinal or hilar mass or adenopathy
visualized. A small hiatal hernia is noted.

Lungs/Pleura: No worrisome pulmonary lesions or pulmonary nodules.

Upper Abdomen: The visualized upper abdomen is grossly normal.

Musculoskeletal: No breast masses.  No worrisome bone lesions.
IMPRESSION: 1. Small hiatal hernia.
2. Aortic and coronary artery calcifications.

Aortic Atherosclerosis (17AKD-CIJ.J).

## 2022-05-11 DIAGNOSIS — H903 Sensorineural hearing loss, bilateral: Secondary | ICD-10-CM | POA: Diagnosis not present

## 2022-05-11 DIAGNOSIS — T169XXA Foreign body in ear, unspecified ear, initial encounter: Secondary | ICD-10-CM | POA: Diagnosis not present

## 2022-06-09 ENCOUNTER — Telehealth (HOSPITAL_BASED_OUTPATIENT_CLINIC_OR_DEPARTMENT_OTHER): Payer: Self-pay | Admitting: Cardiovascular Disease

## 2022-06-09 NOTE — Telephone Encounter (Signed)
Let message for patient to call and schedule the follow up carotid doppler ordered by Dr. Oval Linsey

## 2022-07-07 ENCOUNTER — Ambulatory Visit (INDEPENDENT_AMBULATORY_CARE_PROVIDER_SITE_OTHER): Payer: Medicare PPO

## 2022-07-07 DIAGNOSIS — I779 Disorder of arteries and arterioles, unspecified: Secondary | ICD-10-CM | POA: Diagnosis not present

## 2022-07-07 DIAGNOSIS — I6521 Occlusion and stenosis of right carotid artery: Secondary | ICD-10-CM | POA: Diagnosis not present

## 2022-07-10 ENCOUNTER — Telehealth (HOSPITAL_BASED_OUTPATIENT_CLINIC_OR_DEPARTMENT_OTHER): Payer: Self-pay

## 2022-07-10 DIAGNOSIS — I6523 Occlusion and stenosis of bilateral carotid arteries: Secondary | ICD-10-CM

## 2022-07-10 NOTE — Telephone Encounter (Signed)
Called results to patient and left results on VM (ok per DPR), instructions left to call office back if patient has any questions!      Very mild blockage in the right carotid and minimal plaque on the left.  This is stable to slightly improved from last year.  Repeat in one year.

## 2022-07-10 NOTE — Telephone Encounter (Deleted)
-----   Message from Skeet Latch, MD sent at 07/08/2022  1:36 PM EST ----- Very mild blockage in the right carotid and minimal plaque on the left.  This is stable to slightly improved from last year.  Repeat in one year.

## 2022-07-13 ENCOUNTER — Encounter: Payer: Self-pay | Admitting: Internal Medicine

## 2022-07-13 ENCOUNTER — Ambulatory Visit: Payer: Medicare PPO | Admitting: Internal Medicine

## 2022-07-13 VITALS — BP 160/82 | HR 83 | Ht 62.0 in | Wt 133.4 lb

## 2022-07-13 DIAGNOSIS — E785 Hyperlipidemia, unspecified: Secondary | ICD-10-CM | POA: Diagnosis not present

## 2022-07-13 DIAGNOSIS — E1165 Type 2 diabetes mellitus with hyperglycemia: Secondary | ICD-10-CM | POA: Diagnosis not present

## 2022-07-13 DIAGNOSIS — R809 Proteinuria, unspecified: Secondary | ICD-10-CM | POA: Diagnosis not present

## 2022-07-13 LAB — POCT GLYCOSYLATED HEMOGLOBIN (HGB A1C): Hemoglobin A1C: 7.5 % — AB (ref 4.0–5.6)

## 2022-07-13 MED ORDER — SEMAGLUTIDE (1 MG/DOSE) 4 MG/3ML ~~LOC~~ SOPN: 1.0000 mg | PEN_INJECTOR | SUBCUTANEOUS | 3 refills | Status: DC

## 2022-07-13 NOTE — Patient Instructions (Addendum)
Please increase: - Ozempic 1 mg weekly   Continue: - Glipizide XL 2.5 mg 2x a day before meals   Please return in 4 months with your sugar log.

## 2022-07-13 NOTE — Progress Notes (Signed)
Follow-up patient ID: Sandra [name], female   DOB: 1945-09-17, 77 y.o.   MRN: [id number]   HPI: Sandra [name] is a 77 y.o.-year-old female, returning for f/u for DM2, dx in 2015, non-insulin-dependent, uncontrolled, with long term complications (MAU, bilateral carotid artery stenosis). Last visit 4 months ago. New PCP at [facility] - Dr. Louis Matte.  Interim history: No increased urination, blurry vision, nausea, chest pain. Before last visit, she started to exercise consistently and she also started to work on her diet.  She has earlier meals and reduced portions.  Also, she eliminated sweet drinks. She did not exercise during the winter and also relaxed diet.  Sugars are higher. Next week for her 77th birthday, she goes to [place] with family.  Reviewed HbA1c levels: Lab Results  Component Value Date   HGBA1C 6.9 (A) 03/14/2022   HGBA1C 8.2 (A) 11/22/2021   HGBA1C 7.6 (A) 07/05/2021   HGBA1C 6.9 (A) 03/03/2021   HGBA1C 7.1 (A) 10/28/2020   HGBA1C 6.6 (A) 07/01/2020   HGBA1C 8.5 (A) 03/09/2020   HGBA1C 7.4 (A) 11/04/2019   HGBA1C 8.1 (A) 07/17/2019   HGBA1C 7.0 (A) 03/13/2019   HGBA1C 6.8 (A) 11/11/2018   HGBA1C 7.6 (A) 07/01/2018   HGBA1C 8.2 (A) 02/25/2018   HGBA1C 9.2 (A) 10/12/2017   HGBA1C 7.4 06/13/2017   HGBA1C 7.4 03/13/2017   HGBA1C 7.5 12/11/2016   HGBA1C 7.7 07/10/2016   HGBA1C 7.3 04/10/2016   HGBA1C 7.7 12/15/2015  07/30/2019: HbA1c 8.3% 10/2015: HbA1c 8.2%  Pt is on a regimen of: - Glipizide XL 2.5 mg daily >> inconsistently  >> 2x a day -  >> Ozempic 0.5 mg weekly - gas and reflux resolved She was on Jardiance 10 mg daily in am - started 07/2016 >> could not tolerate it b/c increased urination and hyperglycemia  She was on Metformin 500 mg 1x a day with dinner (500 mg tid >> AP/diarrhea)  >> stopped when started Januvia She was then on metformin ER which she could not tolerate due to diarrhea.  Pt checks her sugars 0-1x a day: - am: 134-199 >> 165-186, but  93-178 >> 146-189 - 2h after b'fast:  170s >> 135-161, 258 >> n/c - before lunch:  125 >> 150s >> 160-180 >> n/c - 2h after lunch:  142 >> n/c >> 125-158 >> 200 >> 151 - before dinner: 128-165 >> n/c >> 196 >> n/c >> 189 - 2h after dinner: n/c >> 137 >> n/c >> 132, 141 - bedtime: 140s-150s >> 138 >> n/c >> 119-129 >> 122, 187 - nighttime: n/c >> 169-208 >> n/c  It is unclear at which CBG level she has hypoglycemia awareness. Lowest: 110 >> 134 >> 93 >> 122 Highest: 215 x1 >> 199 >> 226 >> 187  Glucometer: OneTouch >> AccuChek  Her GFR is not decreased, but she has microalbuminuria: 02/06/2022: 16/0.83, GFR 74, glucose 176 08/24/2021: 23/0.97, GFR 61, ACR 125.5, glucose 186 Lab Results  Component Value Date   BUN 18 06/17/2021   BUN 20 06/02/2020   CREATININE 1.01 (H) 06/17/2021   CREATININE 0.95 06/02/2020   Lab Results  Component Value Date   MICRALBCREAT 5.6 11/11/2018  08/11/2020: 17/0.88, ACR 94.6 07/30/2019: ACR 36.9, BUN/creatinine 19/0.86 07/09/2018: glucose of 173.  BUN/creatinine 13/0.75, GFR 92, ACR 96.3 07/04/2017: 21/0.73, ACR not checked 01/12/2017: Glu 181, 18/0.8, eGFR 86, ACR 42.1 10/22/2015: 21/0.79, ACR 35.6 On Micardis 80.  + HL; last set of lipids: 02/06/2022: 223/130/52/147 08/24/2021: 204/216/44/122 Lab Results  Component Value Date   CHOL 183 06/17/2021   HDL 47 06/17/2021   LDLCALC 89 06/17/2021   TRIG 286 (H) 06/17/2021   CHOLHDL 3.9 06/17/2021  08/11/2020: 201/202/42/124 07/30/2019: 208/278/42/118 07/09/2018: 195/250/40/106 07/04/2017: 207/381/40/91 01/12/2017: 199/380/41/82 10/22/2015: 226/201/48/138  Previously on rosuvastatin 5 mg weekly >> then Lipitor 10 mg weekly >> advised her to take it 3 times a week at least. She stopped since last OV >> restarted at least 1x a week >> now increased to daily  - last eye exam was in 03/2022: No DR reportedly; has high intraocular pressure - VF normal.  On eyedrops.  She sees Dr. Prudencio Burly.  She has a history of  cataract surgery.  - no numbness and tingling in her feet. Foot exam performed 11/23/2021.  TSH on 08/11/2020: 1.10, on 08/24/2021: TSH 0.81 B12 on 08/11/2020: 261  ROS: + see HPI  I reviewed pt's medications, allergies, PMH, social hx, family hx, and changes were documented in the history of present illness. Otherwise, unchanged from my initial visit note.  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   Cervical pain 12/15/2015   Diverticulitis of colon 12/15/2015   Acid reflux 12/15/2015   Essential (primary) hypertension 12/15/2015   Benign breast lumps 12/15/2015   Knee pain 11/08/2011   Social History   Social History   Marital status: Married    Spouse name: N/A   Number of children: 2   Occupational History   Retired    Social History Main Topics   Smoking status: Never Smoker   Smokeless tobacco: Never Used   Alcohol use Socially    Drug use: No    Current Outpatient Medications on File Prior to Visit  Medication Sig Dispense Refill   amLODipine (NORVASC) 10 MG tablet TAKE 1 TABLET BY MOUTH EVERY DAY 90 tablet 3   atorvastatin (LIPITOR) 10 MG tablet Take 10 mg by mouth daily.     Blood Glucose Monitoring Suppl (ONETOUCH VERIO) w/Device KIT Use to check blood sugar daily 1 kit 0   carvedilol (COREG) 12.5 MG tablet TAKE 1 TABLET BY MOUTH TWICE A DAY 180 tablet 2   COVID-19 mRNA vaccine, Moderna, 100 MCG/0.5ML injection Inject into the muscle. 0.25 mL 0   glipiZIDE (GLUCOTROL XL) 2.5 MG 24 hr tablet TAKE 1 TABLET (2.5 MG TOTAL) BY MOUTH 2 (TWO) TIMES DAILY BEFORE A MEAL. 180 tablet 3   glucose blood (ONETOUCH VERIO) test strip  CHECK BLOOD SUGAR IN THE MORNING PRIOR TO BREAKFAST 100 strip 4   Insulin Pen Needle 32G X 4 MM MISC Use to inject insulin weekly 100 each 5   Lancets (ONETOUCH DELICA PLUS 123XX123) MISC USE TO CHECK BLOOD SUGAR TWO TIMES A DAY. 100 each 2   latanoprost (XALATAN) 0.005 % ophthalmic solution Place 1 drop into both eyes at bedtime.     MICARDIS HCT 80-25 MG per tablet Take 1 tablet by mouth daily.     Multiple Vitamins-Minerals (WOMENS MULTIVITAMIN PO) Take by mouth daily.     Semaglutide,0.25 or 0.'5MG'$ /DOS, 2 MG/3ML SOPN Inject 0.5 mg under skin weekly 9 mL 3   No current facility-administered medications on file prior  to visit.    Allergies  Allergen Reactions   Metformin Hcl Er Other (See Comments)   Sulfa Antibiotics Rash   Family History  Problem Relation Age of Onset   Peripheral Artery Disease Sister    Stroke Brother    Diabetes Brother    Heart attack Brother    Lung cancer Sister    Stroke Brother    CAD Brother    Heart disease Sister    PE: BP (!) 160/82 (BP Location: Left Arm, Patient Position: Sitting, Cuff Size: Normal)   Pulse 83   Ht '5\' 2"'$  (1.575 m)   Wt 133 lb 6.4 oz (60.5 kg)   SpO2 97%   BMI 24.40 kg/m  Wt Readings from Last 3 Encounters:  07/13/22 133 lb 6.4 oz (60.5 kg)  03/14/22 130 lb 12.8 oz (59.3 kg)  02/14/22 134 lb 11.2 oz (61.1 kg)   Constitutional: normal weight, in NAD Eyes:EOMI, no exophthalmos ENT: no thyromegaly, no cervical lymphadenopathy Cardiovascular: RRR, No MRG Respiratory: CTA B Musculoskeletal: no deformities Skin: no rashes Neurological: no tremor with outstretched hands  ASSESSMENT: 1. DM2, non-insulin-dependent, uncontrolled, with with complications -Bilateral carotid artery stenosis  2. Microalbuminuria  3. HL  PLAN:  1. Patient with longstanding, uncontrolled, type 2 diabetes, with improved control after completing a 12-week exercise and nutrition program at the [facility] in the past.  However, afterwards, diabetes  control worsened especially after coming off metformin due to GI symptoms.  She continues to have bloating and gas and we discussed that this could be related to Trulicity.  We switched to Ozempic, which she now tolerates well.  HbA1c at last visit was lower, at 6.9%.  Sugars also appear to have improved but she did have higher blood sugars before last visit.  She just started to exercise consistently and I advised her to continue to do this.  We did not change her regimen. -At today's visit, she is not completely sure which Ozempic dose she is getting as her husband usually injects her.  We called the husband during the appointment and he confirmed that she is, indeed, taking the 0.5 mg weekly.  We discussed about increasing the dose to 1 mg weekly since sugars appear to be higher mostly in the morning, but also later in the day. -Will continue glipizide for now at the current dose - I suggested to:  Patient Instructions  Please increase: - Ozempic 1 mg weekly   Continue: - Glipizide XL 2.5 mg 2x a day before meals   Please return in 4 months with your sugar log.   - we checked her HbA1c: 7.5% (higher) - advised to check sugars at different times of the day - 1x a day, rotating check times - advised for yearly eye exams >> she is UTD - return to clinic in 4 months  2. Microalbuminuria -Likely related to diabetes -Continues on Micardis 80 mg daily -Latest level was elevated: 125.5 in 08/2021  3. HL -Reviewed latest lipid panel from 02/2022: LDL above our target of less than 55 due to cardiovascular disease, triglycerides much improved: 223/130/52/147 -She was previously on Lipitor 10 mg daily once a week due to intolerance but PCP recommended to increase it to daily.  Philemon Kingdom, MD PhD [facility] Endocrinology

## 2022-07-28 DIAGNOSIS — I10 Essential (primary) hypertension: Secondary | ICD-10-CM | POA: Diagnosis not present

## 2022-07-28 DIAGNOSIS — R55 Syncope and collapse: Secondary | ICD-10-CM | POA: Diagnosis not present

## 2022-07-28 DIAGNOSIS — E1169 Type 2 diabetes mellitus with other specified complication: Secondary | ICD-10-CM | POA: Diagnosis not present

## 2022-07-28 DIAGNOSIS — E119 Type 2 diabetes mellitus without complications: Secondary | ICD-10-CM | POA: Diagnosis not present

## 2022-08-09 DIAGNOSIS — H25012 Cortical age-related cataract, left eye: Secondary | ICD-10-CM | POA: Diagnosis not present

## 2022-08-09 DIAGNOSIS — H2512 Age-related nuclear cataract, left eye: Secondary | ICD-10-CM | POA: Diagnosis not present

## 2022-08-09 DIAGNOSIS — Z961 Presence of intraocular lens: Secondary | ICD-10-CM | POA: Diagnosis not present

## 2022-08-09 DIAGNOSIS — H401132 Primary open-angle glaucoma, bilateral, moderate stage: Secondary | ICD-10-CM | POA: Diagnosis not present

## 2022-08-21 DIAGNOSIS — S0012XA Contusion of left eyelid and periocular area, initial encounter: Secondary | ICD-10-CM | POA: Diagnosis not present

## 2022-08-24 DIAGNOSIS — J0100 Acute maxillary sinusitis, unspecified: Secondary | ICD-10-CM | POA: Diagnosis not present

## 2022-10-05 DIAGNOSIS — E538 Deficiency of other specified B group vitamins: Secondary | ICD-10-CM | POA: Diagnosis not present

## 2022-10-05 DIAGNOSIS — E1169 Type 2 diabetes mellitus with other specified complication: Secondary | ICD-10-CM | POA: Diagnosis not present

## 2022-10-05 DIAGNOSIS — Z1331 Encounter for screening for depression: Secondary | ICD-10-CM | POA: Diagnosis not present

## 2022-10-05 DIAGNOSIS — Z9181 History of falling: Secondary | ICD-10-CM | POA: Diagnosis not present

## 2022-10-05 DIAGNOSIS — I10 Essential (primary) hypertension: Secondary | ICD-10-CM | POA: Diagnosis not present

## 2022-10-05 DIAGNOSIS — E559 Vitamin D deficiency, unspecified: Secondary | ICD-10-CM | POA: Diagnosis not present

## 2022-10-05 DIAGNOSIS — Z0000 Encounter for general adult medical examination without abnormal findings: Secondary | ICD-10-CM | POA: Diagnosis not present

## 2022-10-05 DIAGNOSIS — K219 Gastro-esophageal reflux disease without esophagitis: Secondary | ICD-10-CM | POA: Diagnosis not present

## 2022-10-05 DIAGNOSIS — E1165 Type 2 diabetes mellitus with hyperglycemia: Secondary | ICD-10-CM | POA: Diagnosis not present

## 2022-10-05 DIAGNOSIS — I700 Atherosclerosis of aorta: Secondary | ICD-10-CM | POA: Diagnosis not present

## 2022-10-05 DIAGNOSIS — Z1211 Encounter for screening for malignant neoplasm of colon: Secondary | ICD-10-CM | POA: Diagnosis not present

## 2022-10-05 DIAGNOSIS — Z79899 Other long term (current) drug therapy: Secondary | ICD-10-CM | POA: Diagnosis not present

## 2022-10-09 ENCOUNTER — Other Ambulatory Visit: Payer: Self-pay | Admitting: Internal Medicine

## 2022-10-09 DIAGNOSIS — Z780 Asymptomatic menopausal state: Secondary | ICD-10-CM

## 2022-11-13 DIAGNOSIS — Z1212 Encounter for screening for malignant neoplasm of rectum: Secondary | ICD-10-CM | POA: Diagnosis not present

## 2022-11-13 DIAGNOSIS — Z1211 Encounter for screening for malignant neoplasm of colon: Secondary | ICD-10-CM | POA: Diagnosis not present

## 2022-11-16 ENCOUNTER — Encounter: Payer: Self-pay | Admitting: Internal Medicine

## 2022-11-16 ENCOUNTER — Ambulatory Visit: Payer: Medicare PPO | Admitting: Internal Medicine

## 2022-11-16 VITALS — BP 138/88 | HR 83 | Ht 62.0 in | Wt 130.6 lb

## 2022-11-16 DIAGNOSIS — E785 Hyperlipidemia, unspecified: Secondary | ICD-10-CM

## 2022-11-16 DIAGNOSIS — E1165 Type 2 diabetes mellitus with hyperglycemia: Secondary | ICD-10-CM

## 2022-11-16 DIAGNOSIS — R809 Proteinuria, unspecified: Secondary | ICD-10-CM | POA: Diagnosis not present

## 2022-11-16 DIAGNOSIS — Z7984 Long term (current) use of oral hypoglycemic drugs: Secondary | ICD-10-CM | POA: Diagnosis not present

## 2022-11-16 DIAGNOSIS — E119 Type 2 diabetes mellitus without complications: Secondary | ICD-10-CM

## 2022-11-16 DIAGNOSIS — Z7985 Long-term (current) use of injectable non-insulin antidiabetic drugs: Secondary | ICD-10-CM | POA: Diagnosis not present

## 2022-11-16 LAB — HEMOGLOBIN A1C: Hemoglobin A1C: 7.3

## 2022-11-16 MED ORDER — GLIPIZIDE ER 2.5 MG PO TB24: ORAL_TABLET | ORAL | 3 refills | Status: DC

## 2022-11-16 NOTE — Progress Notes (Signed)
Follow-up [name] ID: Sandra [name], female   DOB: [birth year], 77 y.o.   MRN: [id number]   HPI: Sandra [name] is a 77 y.o.-year-old female, returning for f/u for DM2, dx in 2015, non-insulin-dependent, uncontrolled, with long term complications (MAU, bilateral carotid artery stenosis). Last visit 4 months ago. New PCP at [facility] - Dr. Margaretann [name].  Interim history: No increased urination, blurry vision, nausea, chest pain. She had Covid19 2 weeks ago while in [place]. Sugars were higher. Also, she mentions that she only increased her Ozempic dose approximately 1 month ago.  She tolerates it well, without GI symptoms.  Reviewed HbA1c levels: Lab Results  Component Value Date   HGBA1C 7.5 (A) 07/13/2022   HGBA1C 6.9 (A) 03/14/2022   HGBA1C 8.2 (A) 11/22/2021   HGBA1C 7.6 (A) 07/05/2021   HGBA1C 6.9 (A) 03/03/2021   HGBA1C 7.1 (A) 10/28/2020   HGBA1C 6.6 (A) 07/01/2020   HGBA1C 8.5 (A) 03/09/2020   HGBA1C 7.4 (A) 11/04/2019   HGBA1C 8.1 (A) 07/17/2019   HGBA1C 7.0 (A) 03/13/2019   HGBA1C 6.8 (A) 11/11/2018   HGBA1C 7.6 (A) 07/01/2018   HGBA1C 8.2 (A) 02/25/2018   HGBA1C 9.2 (A) 10/12/2017   HGBA1C 7.4 06/13/2017   HGBA1C 7.4 03/13/2017   HGBA1C 7.5 12/11/2016   HGBA1C 7.7 07/10/2016   HGBA1C 7.3 04/10/2016  07/30/2019: HbA1c 8.3% 10/2015: HbA1c 8.2%  Pt is on a regimen of: - Glipizide XL 2.5 mg daily >> inconsistently  >> 2x a day -  >> Ozempic 0.5 mg weekly - gas and reflux resolved >> 1 mg weekly (only started 1 mo ago) She was on Jardiance 10 mg daily in am - started 07/2016 >> could not tolerate it b/c increased urination and hyperglycemia  She was on Metformin 500 mg 1x a day with dinner (500 mg tid >> AP/diarrhea)  >> stopped when started Januvia She was then on metformin ER which she could not tolerate due to diarrhea.  Pt checks her sugars 0-1x a day: - am: 134-199 >> 165-186, but 93-178 >> 146-189 >> 143-148, 181 - 2h after b'fast:  170s >> 135-161, 258 >> n/c -  before lunch:  125 >> 150s >> 160-180 >> n/c - 2h after lunch:  142 >> n/c >> 125-158 >> 200 >> 151 >> n/c - before dinner: 128-165 >> n/c >> 196 >> n/c >> 189 >> 118, 146 - 2h after dinner: n/c >> 137 >> n/c >> 132, 141 >> n/c - bedtime: 140s-150s >> 138 >> n/c >> 119-129 >> 122, 187 >> 139 - nighttime: n/c >> 169-208 >> n/c  It is unclear at which CBG level she has hypoglycemia awareness. Lowest: 93 >> 122 >> 118 Highest: 226 >> 187 >> 181  Glucometer: OneTouch >> AccuChek  Her GFR is not decreased, but she has microalbuminuria: 02/06/2022: 16/0.83, GFR 74, glucose 176 08/24/2021: 23/0.97, GFR 61, ACR 125.5, glucose 186 Lab Results  Component Value Date   BUN 18 06/17/2021   BUN 20 06/02/2020   CREATININE 1.01 (H) 06/17/2021   CREATININE 0.95 06/02/2020   Lab Results  Component Value Date   MICRALBCREAT 5.6 11/11/2018  08/11/2020: 17/0.88, ACR 94.6 07/30/2019: ACR 36.9, BUN/creatinine 19/0.86 07/09/2018: glucose of 173.  BUN/creatinine 13/0.75, GFR 92, ACR 96.3 07/04/2017: 21/0.73, ACR not checked 01/12/2017: Glu 181, 18/0.8, eGFR 86, ACR 42.1 10/22/2015: 21/0.79, ACR 35.6 On Micardis 80.  + HL; last set of lipids: Lipid Panel w/reflex Reviewed date:10/05/2022 01:11:43 PM Interpretation: Performing Lab: Notes/Report: Testing Performed at:  [facility], [address], [address], [place], [place] [address]  Cholesterol 180 <200 mg/dL    CHOL/HDL 3.9 6.0-4.5 Ratio    HDLD 46 30-85 mg/dL Values below 40 mg/dL indicate increased risk factor  Triglyceride 195 0-199 mg/dL    NHDL 409 8-119 mg/dL Range dependent upon risk factors.  LDL Chol Calc (NIH) 100 0-99 mg/dL    14/11/8293: 621/308/65/784 08/24/2021: 204/216/44/122 Lab Results  Component Value Date   CHOL 183 06/17/2021   HDL 47 06/17/2021   LDLCALC 89 06/17/2021   TRIG 286 (H) 06/17/2021   CHOLHDL 3.9 06/17/2021  08/11/2020: 201/202/42/124 07/30/2019: 208/278/42/118 07/09/2018: 195/250/40/106 07/04/2017:  207/381/40/91 01/12/2017: 199/380/41/82 10/22/2015: 226/201/48/138  Previously on rosuvastatin 5 mg weekly >> then Lipitor 10 mg weekly >> advised her to take it 3 times a week at least. She stopped since last OV >> restarted at least 1x a week >> now increased to daily  - last eye exam was in 03/2022: No DR reportedly; has high intraocular pressure - VF normal.  On eyedrops.  She sees Dr. Randon [name].  She has a history of cataract surgery.  - no numbness and tingling in her feet. Foot exam performed 11/23/2021.  ROS: + see HPI  I reviewed pt's medications, allergies, PMH, social hx, family hx, and changes were documented in Sandra history of present illness. Otherwise, unchanged from my initial visit note.  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   Cervical pain 12/15/2015   Diverticulitis of colon 12/15/2015   Acid reflux 12/15/2015   Essential (primary) hypertension 12/15/2015   Benign breast lumps 12/15/2015   Knee pain 11/08/2011   Social History   Social History   Marital status: Married    Spouse name: N/A   Number of children: 2   Occupational History   Retired    Social History Main Topics   Smoking status: Never Smoker   Smokeless tobacco: Never Used   Alcohol use Socially    Drug use: No    Current Outpatient Medications on File Prior to Visit  Medication Sig Dispense Refill   amLODipine (NORVASC) 10 MG tablet TAKE 1 TABLET BY MOUTH EVERY DAY 90 tablet 3   atorvastatin (LIPITOR) 10 MG tablet Take 10 mg by mouth daily.     Blood Glucose Monitoring Suppl (ONETOUCH VERIO) w/Device KIT Use to check blood sugar daily 1 kit 0    carvedilol (COREG) 12.5 MG tablet TAKE 1 TABLET BY MOUTH TWICE A DAY 180 tablet 2   COVID-19 mRNA vaccine, Moderna, 100 MCG/0.5ML injection Inject into Sandra muscle. 0.25 mL 0   glipiZIDE (GLUCOTROL XL) 2.5 MG 24 hr tablet TAKE 1 TABLET (2.5 MG TOTAL) BY MOUTH 2 (TWO) TIMES DAILY BEFORE A MEAL. 180 tablet 3   glucose blood (ONETOUCH VERIO) test strip CHECK BLOOD SUGAR IN Sandra MORNING PRIOR TO BREAKFAST 100 strip 4   Insulin Pen Needle 32G X 4 MM MISC Use to inject insulin weekly 100 each 5   Lancets (ONETOUCH DELICA PLUS LANCET33G) MISC USE TO CHECK BLOOD SUGAR TWO TIMES A DAY. 100 each 2   latanoprost (XALATAN) 0.005 % ophthalmic solution Place 1 drop into both  eyes at bedtime.     MICARDIS HCT 80-25 MG per tablet Take 1 tablet by mouth daily.     Multiple Vitamins-Minerals (WOMENS MULTIVITAMIN PO) Take by mouth daily.     Semaglutide, 1 MG/DOSE, 4 MG/3ML SOPN Inject 1 mg as directed once a week. 9 mL 3   No current facility-administered medications on file prior to visit.    Allergies  Allergen Reactions   Metformin Hcl Er Other (See Comments)   Sulfa Antibiotics Rash   Family History  Problem Relation Age of Onset   Peripheral Artery Disease Sister    Stroke Brother    Diabetes Brother    Heart attack Brother    Lung cancer Sister    Stroke Brother    CAD Brother    Heart disease Sister    PE: BP 138/88   Pulse 83   Ht 5\' 2"  (1.575 m)   Wt 130 lb 9.6 oz (59.2 kg)   SpO2 98%   BMI 23.89 kg/m  Wt Readings from Last 3 Encounters:  11/16/22 130 lb 9.6 oz (59.2 kg)  07/13/22 133 lb 6.4 oz (60.5 kg)  03/14/22 130 lb 12.8 oz (59.3 kg)   Constitutional: normal weight, in NAD Eyes:EOMI, no exophthalmos ENT: no thyromegaly, no cervical lymphadenopathy Cardiovascular: RRR, No MRG Respiratory: CTA B Musculoskeletal: no deformities Skin: no rashes Neurological: no tremor with outstretched hands  ASSESSMENT: 1. DM2, non-insulin-dependent, uncontrolled, with with  complications -Bilateral carotid artery stenosis  2. Microalbuminuria  3. HL  PLAN:  1. [name] with longstanding, uncontrolled, type 2 diabetes, with improved control after completing a 12-week exercise and nutrition program at [facility] in Sandra past.  Afterwards, diabetes control worsened especially after coming off metformin due to GI symptoms.  She was on Trulicity with some bloating and gas, but now doing well on Ozempic.  At last visit, HbA1c was higher, at 7.5%.  I advised her to increase Sandra Ozempic dose to 1 mg weekly and continue Sandra same dose of glipizide. -At today's visit, sugars are slightly above target in Sandra morning.  Upon questioning, she had COVID-19 2 weeks ago and sugars are higher.  Also, she did not increase Sandra dose of Ozempic to 1 mg weekly until she finished her 0.5 mg supply, which was approximately 4 weeks ago.  For now, as she feels that her sugars are improving and she feels better, we can continue Sandra same regimen. - I suggested to:  [name] Instructions  Please continue: - Ozempic 1 mg weekly  - Glipizide XL 2.5 mg 2x a day before meals   Please return in 4 months with your sugar log.   - we checked her HbA1c: 7.3% (lower) - advised to check sugars at different times of Sandra day - 1x a day, rotating check times - advised for yearly eye exams >> she is UTD - return to clinic in 4 months  2. Microalbuminuria -Likely related to her diabetes -She is on Micardis 80 mg daily -Latest level was elevated in 08/2021: 125.5 -She is due for another check -will do so at next visit  3. HL - Reviewed latest lipid panel from 02/2022: LDL above our target of less than 55, but improved from 147 to100, triglycerides elevated: Lipid Panel w/reflex Reviewed date:10/05/2022 01:11:43 PM Interpretation: Performing Lab: Notes/Report: Testing Performed at: [facility], [address], [address], [place], [place] [address]  Cholesterol 180 <200 mg/dL    CHOL/HDL 3.9 1.9-1.4  Ratio    HDLD  46 30-85 mg/dL Values below 40 mg/dL indicate increased risk factor  Triglyceride 195 0-199 mg/dL    NHDL 742 5-956 mg/dL Range dependent upon risk factors.  LDL Chol Calc (NIH) 100 0-99 mg/dL   -She was on Lipitor 10 mg daily once a week but PCP recommended to move this to daily.    Carlus Pavlov, MD PhD [facility] Endocrinology

## 2022-11-16 NOTE — Addendum Note (Signed)
Addended by: Carlus Pavlov on: 11/16/2022 08:44 AM   Modules accepted: Level of Service

## 2022-11-16 NOTE — Patient Instructions (Signed)
Please continue: - Ozempic 1 mg weekly  - Glipizide XL 2.5 mg 2x a day before meals   Please return in 4 months with your sugar log.

## 2022-11-18 LAB — EXTERNAL GENERIC LAB PROCEDURE: COLOGUARD: POSITIVE — AB

## 2022-11-18 LAB — COLOGUARD: COLOGUARD: POSITIVE — AB

## 2022-11-21 ENCOUNTER — Other Ambulatory Visit: Payer: Self-pay | Admitting: Internal Medicine

## 2022-11-21 DIAGNOSIS — Z1231 Encounter for screening mammogram for malignant neoplasm of breast: Secondary | ICD-10-CM

## 2022-11-22 ENCOUNTER — Ambulatory Visit: Payer: Medicare PPO

## 2022-11-22 DIAGNOSIS — Z1231 Encounter for screening mammogram for malignant neoplasm of breast: Secondary | ICD-10-CM

## 2022-11-23 ENCOUNTER — Other Ambulatory Visit: Payer: Self-pay | Admitting: Internal Medicine

## 2022-11-23 DIAGNOSIS — R928 Other abnormal and inconclusive findings on diagnostic imaging of breast: Secondary | ICD-10-CM

## 2022-11-30 ENCOUNTER — Ambulatory Visit
Admission: RE | Admit: 2022-11-30 | Discharge: 2022-11-30 | Disposition: A | Discharge status: Home / Self Care | Payer: Medicare PPO | Source: Ambulatory Visit | Attending: Internal Medicine | Admitting: Internal Medicine

## 2022-11-30 ENCOUNTER — Other Ambulatory Visit: Payer: Self-pay | Admitting: Internal Medicine

## 2022-11-30 DIAGNOSIS — R921 Mammographic calcification found on diagnostic imaging of breast: Secondary | ICD-10-CM

## 2022-11-30 DIAGNOSIS — R928 Other abnormal and inconclusive findings on diagnostic imaging of breast: Secondary | ICD-10-CM | POA: Diagnosis not present

## 2022-12-07 DIAGNOSIS — R195 Other fecal abnormalities: Secondary | ICD-10-CM | POA: Diagnosis not present

## 2022-12-18 ENCOUNTER — Other Ambulatory Visit: Payer: Self-pay | Admitting: Cardiovascular Disease

## 2022-12-18 NOTE — Telephone Encounter (Signed)
Rx request sent to pharmacy.  

## 2023-01-01 ENCOUNTER — Other Ambulatory Visit: Payer: Self-pay | Admitting: *Deleted

## 2023-01-01 MED ORDER — CARVEDILOL 12.5 MG PO TABS: 12.5000 mg | ORAL_TABLET | Freq: Two times a day (BID) | ORAL | 1 refills | Status: DC

## 2023-01-30 DIAGNOSIS — Z86010 Personal history of colonic polyps: Secondary | ICD-10-CM | POA: Diagnosis not present

## 2023-01-30 DIAGNOSIS — Z09 Encounter for follow-up examination after completed treatment for conditions other than malignant neoplasm: Secondary | ICD-10-CM | POA: Diagnosis not present

## 2023-01-30 DIAGNOSIS — R195 Other fecal abnormalities: Secondary | ICD-10-CM | POA: Diagnosis not present

## 2023-01-30 DIAGNOSIS — D123 Benign neoplasm of transverse colon: Secondary | ICD-10-CM | POA: Diagnosis not present

## 2023-01-30 DIAGNOSIS — K5730 Diverticulosis of large intestine without perforation or abscess without bleeding: Secondary | ICD-10-CM | POA: Diagnosis not present

## 2023-02-01 DIAGNOSIS — D123 Benign neoplasm of transverse colon: Secondary | ICD-10-CM | POA: Diagnosis not present

## 2023-02-07 DIAGNOSIS — H401113 Primary open-angle glaucoma, right eye, severe stage: Secondary | ICD-10-CM | POA: Diagnosis not present

## 2023-02-28 DIAGNOSIS — H2000 Unspecified acute and subacute iridocyclitis: Secondary | ICD-10-CM | POA: Diagnosis not present

## 2023-03-12 DIAGNOSIS — H401132 Primary open-angle glaucoma, bilateral, moderate stage: Secondary | ICD-10-CM | POA: Diagnosis not present

## 2023-03-16 ENCOUNTER — Other Ambulatory Visit: Payer: Self-pay | Admitting: Cardiovascular Disease

## 2023-03-20 ENCOUNTER — Encounter: Payer: Self-pay | Admitting: Internal Medicine

## 2023-03-20 ENCOUNTER — Ambulatory Visit: Payer: Medicare PPO | Admitting: Internal Medicine

## 2023-03-20 VITALS — BP 136/80 | HR 85 | Ht 62.0 in | Wt 127.0 lb

## 2023-03-20 DIAGNOSIS — Z7984 Long term (current) use of oral hypoglycemic drugs: Secondary | ICD-10-CM | POA: Diagnosis not present

## 2023-03-20 DIAGNOSIS — R809 Proteinuria, unspecified: Secondary | ICD-10-CM | POA: Diagnosis not present

## 2023-03-20 DIAGNOSIS — Z7985 Long-term (current) use of injectable non-insulin antidiabetic drugs: Secondary | ICD-10-CM | POA: Diagnosis not present

## 2023-03-20 DIAGNOSIS — E1165 Type 2 diabetes mellitus with hyperglycemia: Secondary | ICD-10-CM | POA: Diagnosis not present

## 2023-03-20 DIAGNOSIS — E785 Hyperlipidemia, unspecified: Secondary | ICD-10-CM | POA: Diagnosis not present

## 2023-03-20 LAB — POCT GLYCOSYLATED HEMOGLOBIN (HGB A1C): Hemoglobin A1C: 7.4 % — AB (ref 4.0–5.6)

## 2023-03-20 NOTE — Progress Notes (Signed)
Follow-up patient ID: Sandra [name], female   DOB: [birth year], 77 y.o.   MRN: [id number]   HPI: Sandra [name] is a 77 y.o.-year-old female, returning for f/u for DM2, dx in 2015, non-insulin-dependent, uncontrolled, with long term complications (MAU, bilateral carotid artery stenosis). Last visit 4 months ago. New PCP at [facility] - Dr. Margaretann Loveless.  Interim history: No increased urination, blurry vision, nausea, chest pain. She had a positive Cologuard >> colonoscopy showed only 1 polyp. She had gas >> resolved after starting a probiotic. She recently has been busy with her 72-year-old grandson.  She skipped meals and also did not take glipizide consistently due to not eating full meals.  Reviewed HbA1c levels: Lab Results  Component Value Date   HGBA1C 7.3 11/16/2022   HGBA1C 7.5 (A) 07/13/2022   HGBA1C 6.9 (A) 03/14/2022   HGBA1C 8.2 (A) 11/22/2021   HGBA1C 7.6 (A) 07/05/2021   HGBA1C 6.9 (A) 03/03/2021   HGBA1C 7.1 (A) 10/28/2020   HGBA1C 6.6 (A) 07/01/2020   HGBA1C 8.5 (A) 03/09/2020   HGBA1C 7.4 (A) 11/04/2019   HGBA1C 8.1 (A) 07/17/2019   HGBA1C 7.0 (A) 03/13/2019   HGBA1C 6.8 (A) 11/11/2018   HGBA1C 7.6 (A) 07/01/2018   HGBA1C 8.2 (A) 02/25/2018   HGBA1C 9.2 (A) 10/12/2017   HGBA1C 7.4 06/13/2017   HGBA1C 7.4 03/13/2017   HGBA1C 7.5 12/11/2016   HGBA1C 7.7 07/10/2016  07/30/2019: HbA1c 8.3% 10/2015: HbA1c 8.2%  Pt is on a regimen of: - Glipizide XL 2.5 mg daily >> inconsistently  >> 2x a day - skips doses as she does not have full meals -  >> Ozempic 0.5 mg weekly - gas and reflux resolved >> 1 mg weekly She was on Jardiance 10 mg daily in am - started 07/2016 >> could not tolerate it b/c increased urination and hyperglycemia  She was on Metformin 500 mg 1x a day with dinner (500 mg tid >> AP/diarrhea)  >> stopped when started Januvia She was then on metformin ER which she could not tolerate due to diarrhea.  Pt checks her sugars 0-1x a day: - am: 165-186 >> 146-189  >> 143-148, 181 >> 134-180, 209 - 2h after b'fast:  170s >> 135-161, 258 >> n/c - before lunch:  125 >> 150s >> 160-180 >> n/c - 2h after lunch: n/c >> 125-158 >> 200 >> 151 >> n/c - before dinner: 196 >> n/c >> 189 >> 118, 146 >> 133-166 - 2h after dinner: n/c >> 137 >> n/c >> 132, 141 >> n/c - bedtime: n/c >> 119-129 >> 122, 187 >> 139 >> n/c - nighttime: n/c >> 169-208 >> n/c  It is unclear at which CBG level she has hypoglycemia awareness. Lowest: 93 >> 122 >> 118 >> 134 Highest: 226 >> 187 >> 181 >> 209  Glucometer: OneTouch >> AccuChek  Her GFR is not decreased, but she has microalbuminuria: 07/28/2022: BUN 18, GFR 71, glucose 191 02/06/2022: 16/0.83, GFR 74, glucose 176 08/24/2021: 23/0.97, GFR 61, ACR 125.5, glucose 186 Lab Results  Component Value Date   BUN 18 06/17/2021   BUN 20 06/02/2020   CREATININE 1.01 (H) 06/17/2021   CREATININE 0.95 06/02/2020   08/24/2021: Microalbumin 13.17 Lab Results  Component Value Date   MICRALBCREAT 5.6 11/11/2018  08/11/2020: 17/0.88, ACR 94.6 07/30/2019: ACR 36.9, BUN/creatinine 19/0.86 07/09/2018: glucose of 173.  BUN/creatinine 13/0.75, GFR 92, ACR 96.3 07/04/2017: 21/0.73, ACR not checked 01/12/2017: Glu 181, 18/0.8, eGFR 86, ACR 42.1 10/22/2015: 21/0.79, ACR 35.6 On  Micardis 80.  + HL; last set of lipids: Lipid Panel w/reflex Reviewed date:10/05/2022 01:11:43 PM Interpretation: Performing Lab: Notes/Report: Testing Performed at: [facility], [address], [place], [place] [address]  Cholesterol 180 <200 mg/dL    CHOL/HDL 3.9 2.9-5.2 Ratio    HDLD 46 30-85 mg/dL Values below 40 mg/dL indicate increased risk factor  Triglyceride 195 0-199 mg/dL    NHDL 841 3-244 mg/dL Range dependent upon risk factors.  LDL Chol Calc (NIH) 100 0-99 mg/dL    01/0/2725: 366/440/34/742 08/24/2021: 204/216/44/122 Lab Results  Component Value Date   CHOL 183 06/17/2021   HDL 47 06/17/2021   LDLCALC 89 06/17/2021   TRIG 286 (H)  06/17/2021   CHOLHDL 3.9 06/17/2021   Previously on rosuvastatin 5 mg weekly >> then Lipitor 10 mg weekly >> now daily.  - last eye exam was in 03/2023: No DR reportedly; has high intraocular pressure - VF normal.  On eyedrops.  She sees Dr. Randon Goldsmith.  She has a history of cataract surgery.  - no numbness and tingling in her feet. Foot exam performed 11/16/2022.  ROS: + see HPI  I reviewed pt's medications, allergies, PMH, social hx, family hx, and changes were documented in the history of present illness. Otherwise, unchanged from my initial visit note.  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   Cervical pain 12/15/2015   Diverticulitis of colon 12/15/2015   Acid reflux 12/15/2015   Essential (primary) hypertension 12/15/2015   Benign breast lumps 12/15/2015   Knee pain 11/08/2011   Social History   Social History   Marital status: Married    Spouse name: N/A   Number of children: 2   Occupational History   Retired    Social History Main Topics   Smoking status: Never Smoker   Smokeless tobacco: Never Used   Alcohol use Socially    Drug use: No    Current Outpatient Medications on File Prior to Visit  Medication Sig Dispense Refill   amLODipine (NORVASC) 10 MG tablet TAKE 1 TABLET (10 MG TOTAL) BY MOUTH DAILY. PLEASE CALL TO SCHEDULE 1 YEAR FOLLOW-UP APPOINTMENT FOR FUTURE REFILLS. 30 tablet 0   atorvastatin (LIPITOR) 10 MG tablet Take 10 mg by mouth daily.     Blood Glucose Monitoring Suppl (ONETOUCH VERIO) w/Device KIT Use to check blood sugar daily 1 kit 0   carvedilol (COREG) 12.5 MG tablet Take 1 tablet (12.5 mg total) by  mouth 2 (two) times daily. 180 tablet 1   COVID-19 mRNA vaccine, Moderna, 100 MCG/0.5ML injection Inject into the muscle. 0.25 mL 0   glipiZIDE (GLUCOTROL XL) 2.5 MG 24 hr tablet TAKE 1 TABLET (2.5 MG TOTAL) BY MOUTH 2 (TWO) TIMES DAILY BEFORE A MEAL. 180 tablet 3   glucose blood (ONETOUCH VERIO) test strip CHECK BLOOD SUGAR IN THE MORNING PRIOR TO BREAKFAST 100 strip 4   Insulin Pen Needle 32G X 4 MM MISC Use to inject insulin weekly 100 each 5   Lancets (ONETOUCH DELICA PLUS LANCET33G) MISC USE TO CHECK BLOOD SUGAR TWO TIMES A DAY. 100 each 2   latanoprost (XALATAN) 0.005 % ophthalmic solution Place 1 drop  into both eyes at bedtime.     MICARDIS HCT 80-25 MG per tablet Take 1 tablet by mouth daily.     Multiple Vitamins-Minerals (WOMENS MULTIVITAMIN PO) Take by mouth daily.     Semaglutide, 1 MG/DOSE, 4 MG/3ML SOPN Inject 1 mg as directed once a week. 9 mL 3   No current facility-administered medications on file prior to visit.    Allergies  Allergen Reactions   Metformin Hcl Er Other (See Comments)   Sulfa Antibiotics Rash   Family History  Problem Relation Age of Onset   Peripheral Artery Disease Sister    Stroke Brother    Diabetes Brother    Heart attack Brother    Lung cancer Sister    Stroke Brother    CAD Brother    Heart disease Sister    PE: BP 136/80   Pulse 85   Ht 5\' 2"  (1.575 m)   Wt 127 lb (57.6 kg)   SpO2 99%   BMI 23.23 kg/m  Wt Readings from Last 3 Encounters:  03/20/23 127 lb (57.6 kg)  11/16/22 130 lb 9.6 oz (59.2 kg)  07/13/22 133 lb 6.4 oz (60.5 kg)   Constitutional: normal weight, in NAD Eyes:EOMI, no exophthalmos ENT: no thyromegaly, no cervical lymphadenopathy Cardiovascular: RRR, No MRG Respiratory: CTA B Musculoskeletal: no deformities Skin: no rashes Neurological: no tremor with outstretched hands  ASSESSMENT: 1. DM2, non-insulin-dependent, uncontrolled, with with complications -Bilateral carotid artery stenosis  2.  Microalbuminuria  3. HL  PLAN:  1. Patient with longstanding, uncontrolled, type 2 diabetes, with improved control after completing a 12-week exercise and nutrition program at the [facility] in the past.  Afterwards, diabetes control worsened, especially after coming off metformin due to GI symptoms.  She was on Trulicity with some bloating and gas but not doing well on Ozempic.  At last visit, HbA1c was better, at 7.3%.  Sugars were slightly above target in the morning but upon questioning, she had COVID-19 2 weeks prior to the appointment.  Also, she did not increase the dose of Ozempic as advised at the previous visit, but only after she finished her lower dose supply, approximately 4 weeks prior to our last appointment.  We decided not to change the regimen at that time.  She also continues on glipizide low-dose. -At today's visit, sugars are slightly higher especially in the morning.  She tells me she is not eating full meals, only snacks and does not take glipizide consistently due to not eating for meals.  We discussed that it would be important to try to eat 1-2 regular meals a day, as she continues to lose weight, since otherwise, we may need to back off or even stop Ozempic.  Otherwise, I did not suggest a change in regimen. - I suggested to:  Patient Instructions  Please continue: - Ozempic 1 mg weekly  - Glipizide XL 2.5 mg 2x a day before meals  Please eat a full meal 1-2x a day.    Please return in 4 months with your sugar log.   - we checked her HbA1c: 7.4% (slightly higher) - advised to check sugars at different times of the day - 1x a day, rotating check times - advised for yearly eye exams >> she is UTD - return to clinic in 4 months  2. Microalbuminuria -Likely related to diabetes -She continues on Micardis 80 mg daily -Latest ACR was elevated in 08/2021: 125.5 -She is due for another ACR check-will check this today  3. HL -Reviewed latest lipid panel from 09/2022: LDL above  our target of less than 55 due to cardiovascular disease: Lipid Panel w/reflex Reviewed date:10/05/2022 01:11:43 PM Interpretation: Performing Lab: Notes/Report: Testing Performed at: [facility], [address], [place], [place] [address]  Cholesterol 180 <200 mg/dL    CHOL/HDL 3.9 2.7-2.5 Ratio    HDLD 46 30-85 mg/dL Values below 40 mg/dL indicate increased risk factor  Triglyceride 195 0-199 mg/dL    NHDL 366 4-403 mg/dL Range dependent upon risk factors.  LDL Chol Calc (NIH) 100 0-99 mg/dL   -She was previously on Lipitor 10 mg once a week but PCP recommended to move this daily.  Carlus Pavlov, MD PhD [facility] Endocrinology

## 2023-03-20 NOTE — Patient Instructions (Addendum)
Please continue: - Ozempic 1 mg weekly  - Glipizide XL 2.5 mg 2x a day before meals  Please eat a full meal 1-2x a day.    Please return in 4 months with your sugar log.

## 2023-03-20 NOTE — Addendum Note (Signed)
Addended by: Pollie Meyer on: 03/20/2023 09:36 AM   Modules accepted: Orders

## 2023-03-21 LAB — MICROALBUMIN / CREATININE URINE RATIO
Creatinine,U: 203.7 mg/dL
Microalb Creat Ratio: 11 mg/g (ref 0.0–30.0)
Microalb, Ur: 22.4 mg/dL — ABNORMAL HIGH (ref 0.0–1.9)

## 2023-04-02 DIAGNOSIS — I10 Essential (primary) hypertension: Secondary | ICD-10-CM | POA: Diagnosis not present

## 2023-04-02 DIAGNOSIS — E559 Vitamin D deficiency, unspecified: Secondary | ICD-10-CM | POA: Diagnosis not present

## 2023-04-02 DIAGNOSIS — I779 Disorder of arteries and arterioles, unspecified: Secondary | ICD-10-CM | POA: Diagnosis not present

## 2023-04-02 DIAGNOSIS — Z7184 Encounter for health counseling related to travel: Secondary | ICD-10-CM | POA: Diagnosis not present

## 2023-04-02 DIAGNOSIS — E1165 Type 2 diabetes mellitus with hyperglycemia: Secondary | ICD-10-CM | POA: Diagnosis not present

## 2023-04-02 DIAGNOSIS — Z23 Encounter for immunization: Secondary | ICD-10-CM | POA: Diagnosis not present

## 2023-04-11 ENCOUNTER — Other Ambulatory Visit (HOSPITAL_BASED_OUTPATIENT_CLINIC_OR_DEPARTMENT_OTHER): Payer: Self-pay | Admitting: *Deleted

## 2023-04-11 MED ORDER — AMLODIPINE BESYLATE 10 MG PO TABS: 10.0000 mg | ORAL_TABLET | Freq: Every day | ORAL | 0 refills | Status: DC

## 2023-04-15 ENCOUNTER — Other Ambulatory Visit: Payer: Self-pay | Admitting: Cardiovascular Disease

## 2023-05-11 ENCOUNTER — Other Ambulatory Visit: Payer: Self-pay

## 2023-05-11 ENCOUNTER — Emergency Department (HOSPITAL_BASED_OUTPATIENT_CLINIC_OR_DEPARTMENT_OTHER): Payer: Medicare PPO

## 2023-05-11 ENCOUNTER — Encounter (HOSPITAL_BASED_OUTPATIENT_CLINIC_OR_DEPARTMENT_OTHER): Payer: Self-pay | Admitting: Emergency Medicine

## 2023-05-11 ENCOUNTER — Emergency Department (HOSPITAL_BASED_OUTPATIENT_CLINIC_OR_DEPARTMENT_OTHER)
Admission: EM | Admit: 2023-05-11 | Discharge: 2023-05-11 | Disposition: A | Discharge status: Home / Self Care | Payer: Medicare PPO | Attending: Emergency Medicine | Admitting: Emergency Medicine

## 2023-05-11 DIAGNOSIS — W19XXXD Unspecified fall, subsequent encounter: Secondary | ICD-10-CM

## 2023-05-11 DIAGNOSIS — S199XXA Unspecified injury of neck, initial encounter: Secondary | ICD-10-CM | POA: Diagnosis present

## 2023-05-11 DIAGNOSIS — Y92410 Unspecified street and highway as the place of occurrence of the external cause: Secondary | ICD-10-CM | POA: Insufficient documentation

## 2023-05-11 DIAGNOSIS — S022XXK Fracture of nasal bones, subsequent encounter for fracture with nonunion: Secondary | ICD-10-CM | POA: Diagnosis not present

## 2023-05-11 DIAGNOSIS — Z794 Long term (current) use of insulin: Secondary | ICD-10-CM | POA: Diagnosis not present

## 2023-05-11 DIAGNOSIS — S02401A Maxillary fracture, unspecified, initial encounter for closed fracture: Secondary | ICD-10-CM | POA: Diagnosis not present

## 2023-05-11 DIAGNOSIS — S12000A Unspecified displaced fracture of first cervical vertebra, initial encounter for closed fracture: Secondary | ICD-10-CM | POA: Diagnosis not present

## 2023-05-11 DIAGNOSIS — M5021 Other cervical disc displacement,  high cervical region: Secondary | ICD-10-CM | POA: Diagnosis not present

## 2023-05-11 DIAGNOSIS — I10 Essential (primary) hypertension: Secondary | ICD-10-CM | POA: Diagnosis not present

## 2023-05-11 DIAGNOSIS — E119 Type 2 diabetes mellitus without complications: Secondary | ICD-10-CM | POA: Insufficient documentation

## 2023-05-11 DIAGNOSIS — S12300A Unspecified displaced fracture of fourth cervical vertebra, initial encounter for closed fracture: Secondary | ICD-10-CM | POA: Insufficient documentation

## 2023-05-11 DIAGNOSIS — Z79899 Other long term (current) drug therapy: Secondary | ICD-10-CM | POA: Insufficient documentation

## 2023-05-11 DIAGNOSIS — S129XXA Fracture of neck, unspecified, initial encounter: Secondary | ICD-10-CM

## 2023-05-11 DIAGNOSIS — S022XXA Fracture of nasal bones, initial encounter for closed fracture: Secondary | ICD-10-CM | POA: Diagnosis not present

## 2023-05-11 DIAGNOSIS — R030 Elevated blood-pressure reading, without diagnosis of hypertension: Secondary | ICD-10-CM

## 2023-05-11 DIAGNOSIS — M47812 Spondylosis without myelopathy or radiculopathy, cervical region: Secondary | ICD-10-CM | POA: Diagnosis not present
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
## 2023-05-11 NOTE — ED Notes (Signed)
 CD of patient's CT scans requested from radiology for patient to take to her doctor.

## 2023-05-11 NOTE — ED Provider Notes (Signed)
 I put orders in for patient per triage nurse's request. I received a call from radiology that patient has a stable C4 fracture and a possible C1 fracture and recommended MRI for further evaluation. Nasal bone fracture as well as fracture to the spine of maxillary bone also noted.  I ordered c-collar and cervical MRI, per radiologist's recommendation. Informed triage nurse of results and that she will need next available room. C-collar placed.   Waddell Sluder, PA-C 05/11/23 1528    Ellouise Victoria K, DO 05/11/23 1529

## 2023-05-11 NOTE — Discharge Instructions (Addendum)
 It was a pleasure taking care of you here in the emergency department  Your imaging today did show a broken nose which you are already aware of  Your CT scan and MRI did show a spinous process fracture in your neck.  You do not need to wear a neck brace for this.  I do recommend Tylenol and ibuprofen as needed for pain as well as alternating ice and heat  Make sure to follow-up outpatient  Return for new or worsening symptoms

## 2023-05-15 DIAGNOSIS — S0240CD Maxillary fracture, right side, subsequent encounter for fracture with routine healing: Secondary | ICD-10-CM | POA: Diagnosis not present

## 2023-05-15 DIAGNOSIS — S12300D Unspecified displaced fracture of fourth cervical vertebra, subsequent encounter for fracture with routine healing: Secondary | ICD-10-CM | POA: Diagnosis not present

## 2023-05-15 DIAGNOSIS — S022XXG Fracture of nasal bones, subsequent encounter for fracture with delayed healing: Secondary | ICD-10-CM | POA: Diagnosis not present

## 2023-05-15 DIAGNOSIS — E1169 Type 2 diabetes mellitus with other specified complication: Secondary | ICD-10-CM | POA: Diagnosis not present

## 2023-05-28 DIAGNOSIS — S129XXA Fracture of neck, unspecified, initial encounter: Secondary | ICD-10-CM | POA: Diagnosis not present

## 2023-06-08 ENCOUNTER — Other Ambulatory Visit: Payer: Self-pay | Admitting: Internal Medicine

## 2023-06-12 ENCOUNTER — Ambulatory Visit
Admission: RE | Admit: 2023-06-12 | Discharge: 2023-06-12 | Disposition: A | Discharge status: Home / Self Care | Payer: Medicare PPO | Source: Ambulatory Visit | Attending: Internal Medicine | Admitting: Internal Medicine

## 2023-06-12 DIAGNOSIS — R921 Mammographic calcification found on diagnostic imaging of breast: Secondary | ICD-10-CM | POA: Diagnosis not present

## 2023-06-12 DIAGNOSIS — R928 Other abnormal and inconclusive findings on diagnostic imaging of breast: Secondary | ICD-10-CM

## 2023-06-13 ENCOUNTER — Other Ambulatory Visit: Payer: Self-pay | Admitting: Internal Medicine

## 2023-06-13 DIAGNOSIS — R921 Mammographic calcification found on diagnostic imaging of breast: Secondary | ICD-10-CM

## 2023-06-21 DIAGNOSIS — H401132 Primary open-angle glaucoma, bilateral, moderate stage: Secondary | ICD-10-CM | POA: Diagnosis not present

## 2023-06-22 ENCOUNTER — Encounter: Payer: Self-pay | Admitting: Internal Medicine

## 2023-06-22 ENCOUNTER — Ambulatory Visit
Admission: RE | Admit: 2023-06-22 | Discharge: 2023-06-22 | Disposition: A | Discharge status: Home / Self Care | Payer: Medicare PPO | Source: Ambulatory Visit | Attending: Internal Medicine | Admitting: Internal Medicine

## 2023-06-22 DIAGNOSIS — R921 Mammographic calcification found on diagnostic imaging of breast: Secondary | ICD-10-CM

## 2023-06-22 DIAGNOSIS — N6310 Unspecified lump in the right breast, unspecified quadrant: Secondary | ICD-10-CM | POA: Diagnosis not present

## 2023-06-22 HISTORY — PX: BREAST BIOPSY: SHX20

## 2023-06-23 ENCOUNTER — Other Ambulatory Visit: Payer: Self-pay | Admitting: Cardiovascular Disease

## 2023-06-25 LAB — SURGICAL PATHOLOGY

## 2023-07-10 ENCOUNTER — Ambulatory Visit (HOSPITAL_BASED_OUTPATIENT_CLINIC_OR_DEPARTMENT_OTHER): Payer: Medicare PPO | Admitting: Cardiovascular Disease

## 2023-07-10 ENCOUNTER — Encounter (HOSPITAL_BASED_OUTPATIENT_CLINIC_OR_DEPARTMENT_OTHER): Payer: Self-pay | Admitting: Cardiovascular Disease

## 2023-07-10 VITALS — BP 128/86 | HR 76 | Ht 62.0 in | Wt 127.0 lb

## 2023-07-10 DIAGNOSIS — I10 Essential (primary) hypertension: Secondary | ICD-10-CM

## 2023-07-10 DIAGNOSIS — I6523 Occlusion and stenosis of bilateral carotid arteries: Secondary | ICD-10-CM | POA: Diagnosis not present

## 2023-07-10 DIAGNOSIS — I2510 Atherosclerotic heart disease of native coronary artery without angina pectoris: Secondary | ICD-10-CM | POA: Diagnosis not present

## 2023-07-10 DIAGNOSIS — E7800 Pure hypercholesterolemia, unspecified: Secondary | ICD-10-CM

## 2023-07-10 DIAGNOSIS — Z5181 Encounter for therapeutic drug level monitoring: Secondary | ICD-10-CM

## 2023-07-10 NOTE — Patient Instructions (Signed)
 Medication Instructions:  Your physician recommends that you continue on your current medications as directed. Please refer to the Current Medication list given to you today.   *If you need a refill on your cardiac medications before your next appointment, please call your pharmacy*  Lab Work: FASTING LP/CMET 1 WEEK PRIOR TO FOLLOW UP   If you have labs (blood work) drawn today and your tests are completely normal, you will receive your results only by: MyChart Message (if you have MyChart) OR A paper copy in the mail If you have any lab test that is abnormal or we need to change your treatment, we will call you to review the results.  Testing/Procedures: Your physician has requested that you have a carotid duplex. This test is an ultrasound of the carotid arteries in your neck. It looks at blood flow through these arteries that supply the brain with blood. Allow one hour for this exam. There are no restrictions or special instructions.  Follow-Up:  10/19/2023 10:00 AM WITH DR [facility]  Other Instructions Right Start Program at [facility]  Sessions include Structured exercise sessions 2 group sessions per week for 9 weeks Monitored by fitness app 20 to 40-minute sessions Post program complications such as a neck step.  It is free for [facility] Members or $99 for non-members. Financial assistance is available.  No referral required.  Please call [phone], visit [facility] in person, or register online at [email or web address]    /
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
## 2023-07-27 ENCOUNTER — Other Ambulatory Visit: Payer: Self-pay

## 2023-07-27 MED ORDER — CARVEDILOL 12.5 MG PO TABS: 12.5000 mg | ORAL_TABLET | Freq: Two times a day (BID) | ORAL | 3 refills | Status: AC

## 2023-08-08 ENCOUNTER — Ambulatory Visit: Payer: Medicare PPO | Admitting: Internal Medicine

## 2023-08-08 ENCOUNTER — Encounter: Payer: Self-pay | Admitting: Internal Medicine

## 2023-08-08 VITALS — BP 140/86 | HR 86 | Ht 62.0 in | Wt 125.2 lb

## 2023-08-08 DIAGNOSIS — E1165 Type 2 diabetes mellitus with hyperglycemia: Secondary | ICD-10-CM | POA: Diagnosis not present

## 2023-08-08 DIAGNOSIS — Z7984 Long term (current) use of oral hypoglycemic drugs: Secondary | ICD-10-CM

## 2023-08-08 DIAGNOSIS — E785 Hyperlipidemia, unspecified: Secondary | ICD-10-CM | POA: Diagnosis not present

## 2023-08-08 DIAGNOSIS — Z7985 Long-term (current) use of injectable non-insulin antidiabetic drugs: Secondary | ICD-10-CM

## 2023-08-08 DIAGNOSIS — R809 Proteinuria, unspecified: Secondary | ICD-10-CM | POA: Diagnosis not present

## 2023-08-08 LAB — POCT GLYCOSYLATED HEMOGLOBIN (HGB A1C): Hemoglobin A1C: 7.3 % — AB (ref 4.0–5.6)

## 2023-08-08 NOTE — Patient Instructions (Addendum)
Please continue: - Ozempic 1 mg weekly  - Glipizide XL 2.5 mg 2x a day before meals   Please return in 4 months with your sugar log.
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
## 2023-08-14 ENCOUNTER — Ambulatory Visit (HOSPITAL_BASED_OUTPATIENT_CLINIC_OR_DEPARTMENT_OTHER)

## 2023-08-14 DIAGNOSIS — I6523 Occlusion and stenosis of bilateral carotid arteries: Secondary | ICD-10-CM | POA: Diagnosis not present

## 2023-09-12 ENCOUNTER — Encounter (HOSPITAL_BASED_OUTPATIENT_CLINIC_OR_DEPARTMENT_OTHER): Payer: Self-pay | Admitting: Cardiovascular Disease

## 2023-09-28 DIAGNOSIS — R143 Flatulence: Secondary | ICD-10-CM | POA: Diagnosis not present

## 2023-09-28 DIAGNOSIS — R142 Eructation: Secondary | ICD-10-CM | POA: Diagnosis not present

## 2023-09-29 ENCOUNTER — Other Ambulatory Visit: Payer: Self-pay | Admitting: Internal Medicine

## 2023-10-01 ENCOUNTER — Other Ambulatory Visit: Payer: Self-pay | Admitting: Cardiovascular Disease

## 2023-10-02 MED ORDER — GLIPIZIDE ER 2.5 MG PO TB24: ORAL_TABLET | ORAL | 3 refills | Status: DC

## 2023-10-02 NOTE — Addendum Note (Signed)
 Addended by: Vernon Goodpasture on: 10/02/2023 08:25 AM   Modules accepted: Orders

## 2023-10-05 DIAGNOSIS — E1165 Type 2 diabetes mellitus with hyperglycemia: Secondary | ICD-10-CM | POA: Diagnosis not present

## 2023-10-05 DIAGNOSIS — K219 Gastro-esophageal reflux disease without esophagitis: Secondary | ICD-10-CM | POA: Diagnosis not present

## 2023-10-05 DIAGNOSIS — E559 Vitamin D deficiency, unspecified: Secondary | ICD-10-CM | POA: Diagnosis not present

## 2023-10-05 DIAGNOSIS — R143 Flatulence: Secondary | ICD-10-CM | POA: Diagnosis not present

## 2023-10-05 DIAGNOSIS — Z0000 Encounter for general adult medical examination without abnormal findings: Secondary | ICD-10-CM | POA: Diagnosis not present

## 2023-10-05 DIAGNOSIS — I10 Essential (primary) hypertension: Secondary | ICD-10-CM | POA: Diagnosis not present

## 2023-10-05 DIAGNOSIS — R142 Eructation: Secondary | ICD-10-CM | POA: Diagnosis not present

## 2023-10-05 DIAGNOSIS — R739 Hyperglycemia, unspecified: Secondary | ICD-10-CM | POA: Diagnosis not present

## 2023-10-05 DIAGNOSIS — E538 Deficiency of other specified B group vitamins: Secondary | ICD-10-CM | POA: Diagnosis not present

## 2023-10-05 DIAGNOSIS — E1169 Type 2 diabetes mellitus with other specified complication: Secondary | ICD-10-CM | POA: Diagnosis not present

## 2023-10-15 ENCOUNTER — Other Ambulatory Visit (HOSPITAL_COMMUNITY): Payer: Self-pay | Admitting: Internal Medicine

## 2023-10-15 DIAGNOSIS — Z5181 Encounter for therapeutic drug level monitoring: Secondary | ICD-10-CM | POA: Diagnosis not present

## 2023-10-15 DIAGNOSIS — I10 Essential (primary) hypertension: Secondary | ICD-10-CM | POA: Diagnosis not present

## 2023-10-15 DIAGNOSIS — E7800 Pure hypercholesterolemia, unspecified: Secondary | ICD-10-CM | POA: Diagnosis not present

## 2023-10-16 ENCOUNTER — Ambulatory Visit: Payer: Self-pay | Admitting: Cardiovascular Disease

## 2023-10-16 LAB — COMPREHENSIVE METABOLIC PANEL WITH GFR
ALT: 12 IU/L (ref 0–32)
AST: 17 IU/L (ref 0–40)
Albumin: 4.3 g/dL (ref 3.8–4.8)
Alkaline Phosphatase: 100 IU/L (ref 44–121)
BUN/Creatinine Ratio: 21 (ref 12–28)
BUN: 20 mg/dL (ref 8–27)
Bilirubin Total: 0.3 mg/dL (ref 0.0–1.2)
CO2: 20 mmol/L (ref 20–29)
Calcium: 9.7 mg/dL (ref 8.7–10.3)
Chloride: 100 mmol/L (ref 96–106)
Creatinine, Ser: 0.94 mg/dL (ref 0.57–1.00)
Globulin, Total: 2.8 g/dL (ref 1.5–4.5)
Glucose: 217 mg/dL — ABNORMAL HIGH (ref 70–99)
Potassium: 4.3 mmol/L (ref 3.5–5.2)
Sodium: 138 mmol/L (ref 134–144)
Total Protein: 7.1 g/dL (ref 6.0–8.5)
eGFR: 63 mL/min/{1.73_m2} (ref >59)

## 2023-10-16 LAB — LIPID PANEL
Chol/HDL Ratio: 4.7 ratio — ABNORMAL HIGH (ref 0.0–4.4)
Cholesterol, Total: 199 mg/dL (ref 100–199)
HDL: 42 mg/dL (ref >39)
LDL Chol Calc (NIH): 120 mg/dL — ABNORMAL HIGH (ref 0–99)
Triglycerides: 209 mg/dL — ABNORMAL HIGH (ref 0–149)
VLDL Cholesterol Cal: 37 mg/dL (ref 5–40)

## 2023-10-19 ENCOUNTER — Ambulatory Visit (HOSPITAL_BASED_OUTPATIENT_CLINIC_OR_DEPARTMENT_OTHER): Admitting: Cardiovascular Disease

## 2023-10-19 ENCOUNTER — Encounter (HOSPITAL_BASED_OUTPATIENT_CLINIC_OR_DEPARTMENT_OTHER): Payer: Self-pay | Admitting: Cardiovascular Disease

## 2023-10-19 VITALS — BP 164/80 | HR 81 | Ht 62.0 in | Wt 128.2 lb

## 2023-10-19 DIAGNOSIS — I6523 Occlusion and stenosis of bilateral carotid arteries: Secondary | ICD-10-CM | POA: Diagnosis not present

## 2023-10-19 DIAGNOSIS — I2510 Atherosclerotic heart disease of native coronary artery without angina pectoris: Secondary | ICD-10-CM

## 2023-10-19 DIAGNOSIS — E1165 Type 2 diabetes mellitus with hyperglycemia: Secondary | ICD-10-CM

## 2023-10-19 DIAGNOSIS — I10 Essential (primary) hypertension: Secondary | ICD-10-CM

## 2023-10-19 DIAGNOSIS — E7800 Pure hypercholesterolemia, unspecified: Secondary | ICD-10-CM | POA: Diagnosis not present

## 2023-10-19 MED ORDER — TELMISARTAN-HCTZ 80-25 MG PO TABS
1.0000 | ORAL_TABLET | Freq: Every day | ORAL | 3 refills | Status: AC
Start: 2023-10-19 — End: ?

## 2023-10-19 NOTE — Patient Instructions (Addendum)
 Medication Instructions:  Your physician recommends that you continue on your current medications as directed. Please refer to the Current Medication list given to you today.  *If you need a refill on your cardiac medications before your next appointment, please call your pharmacy*  Lab Work: FASTING LP/CMET IN 2 MONTHS ABOUT A WEEK PRIOR TO FOLLOW UP   If you have labs (blood work) drawn today and your tests are completely normal, you will receive your results only by: MyChart Message (if you have MyChart) OR A paper copy in the mail If you have any lab test that is abnormal or we need to change your treatment, we will call you to review the results.  Testing/Procedures: NONE  Follow-Up: 12/18/2023 9:20 AM WITH DR Woodland   Other Instructions MONITOR YOUR BLOOD PRESSURE DAILY, BRING YOUR READINGS AND MACHINE TO YOUR FOLLOW UP APPOINTMENT
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
## 2023-11-26 DIAGNOSIS — H2512 Age-related nuclear cataract, left eye: Secondary | ICD-10-CM | POA: Diagnosis not present

## 2023-11-26 DIAGNOSIS — H25012 Cortical age-related cataract, left eye: Secondary | ICD-10-CM | POA: Diagnosis not present

## 2023-11-26 DIAGNOSIS — H401132 Primary open-angle glaucoma, bilateral, moderate stage: Secondary | ICD-10-CM | POA: Diagnosis not present

## 2023-11-26 DIAGNOSIS — Z961 Presence of intraocular lens: Secondary | ICD-10-CM | POA: Diagnosis not present

## 2023-11-26 LAB — HM DIABETES EYE EXAM

## 2023-12-03 ENCOUNTER — Other Ambulatory Visit: Payer: Self-pay | Admitting: Internal Medicine

## 2023-12-03 NOTE — Telephone Encounter (Signed)
 Refill request complete

## 2023-12-05 ENCOUNTER — Telehealth (HOSPITAL_BASED_OUTPATIENT_CLINIC_OR_DEPARTMENT_OTHER): Payer: Self-pay | Admitting: Cardiovascular Disease

## 2023-12-05 NOTE — Telephone Encounter (Signed)
 Pt stopped by and informed me that she was told to discontinue her cholesterol medicine. Her primary care is going to change the prescription when she does go in next.

## 2023-12-05 NOTE — Telephone Encounter (Signed)
 S/w pt d/c statin due to severe diaharrea per PCP. Will drop off bp readings next week due to changing appt to a later date.

## 2023-12-05 NOTE — Telephone Encounter (Signed)
 She had to reschedule Aug appt due to another appt conflict. Is added to waitlist in case something does cancel. Please call and advise if you need more information about cholesterol prescription.

## 2023-12-18 ENCOUNTER — Ambulatory Visit (HOSPITAL_BASED_OUTPATIENT_CLINIC_OR_DEPARTMENT_OTHER): Admitting: Cardiovascular Disease

## 2023-12-18 ENCOUNTER — Encounter: Payer: Self-pay | Admitting: Internal Medicine

## 2023-12-18 ENCOUNTER — Ambulatory Visit: Admitting: Internal Medicine

## 2023-12-18 VITALS — BP 138/86 | HR 82 | Ht 62.0 in | Wt 126.0 lb

## 2023-12-18 DIAGNOSIS — Z7985 Long-term (current) use of injectable non-insulin antidiabetic drugs: Secondary | ICD-10-CM | POA: Diagnosis not present

## 2023-12-18 DIAGNOSIS — E1165 Type 2 diabetes mellitus with hyperglycemia: Secondary | ICD-10-CM | POA: Diagnosis not present

## 2023-12-18 DIAGNOSIS — Z7984 Long term (current) use of oral hypoglycemic drugs: Secondary | ICD-10-CM | POA: Diagnosis not present

## 2023-12-18 DIAGNOSIS — R809 Proteinuria, unspecified: Secondary | ICD-10-CM

## 2023-12-18 DIAGNOSIS — E785 Hyperlipidemia, unspecified: Secondary | ICD-10-CM

## 2023-12-18 LAB — POCT GLYCOSYLATED HEMOGLOBIN (HGB A1C): Hemoglobin A1C: 7.6 % — AB (ref 4.0–5.6)

## 2023-12-18 MED ORDER — OZEMPIC (1 MG/DOSE) 4 MG/3ML ~~LOC~~ SOPN: 1.0000 mg | PEN_INJECTOR | SUBCUTANEOUS | 3 refills | Status: DC

## 2023-12-18 MED ORDER — GLIPIZIDE ER 2.5 MG PO TB24: ORAL_TABLET | ORAL | Status: AC
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
## 2023-12-18 NOTE — Patient Instructions (Addendum)
 Please continue: - Ozempic  1 mg weekly   Please take: - Glipizide  XL 5 mg before breakfast   Please return in 4 months with your sugar log.

## 2023-12-19 ENCOUNTER — Ambulatory Visit: Payer: Self-pay | Admitting: Internal Medicine

## 2023-12-19 LAB — MICROALBUMIN / CREATININE URINE RATIO
Creatinine, Urine: 99 mg/dL (ref 20–275)
Microalb Creat Ratio: 237 mg/g{creat} — ABNORMAL HIGH (ref <30)
Microalb, Ur: 23.5 mg/dL

## 2024-01-14 DIAGNOSIS — R350 Frequency of micturition: Secondary | ICD-10-CM | POA: Diagnosis not present

## 2024-02-01 DIAGNOSIS — H401132 Primary open-angle glaucoma, bilateral, moderate stage: Secondary | ICD-10-CM | POA: Diagnosis not present

## 2024-02-19 ENCOUNTER — Other Ambulatory Visit: Payer: Self-pay | Admitting: Internal Medicine

## 2024-02-19 DIAGNOSIS — Z1231 Encounter for screening mammogram for malignant neoplasm of breast: Secondary | ICD-10-CM

## 2024-02-20 ENCOUNTER — Inpatient Hospital Stay: Admission: RE | Admit: 2024-02-20 | Discharge: 2024-02-20 | Attending: Internal Medicine | Admitting: Internal Medicine

## 2024-02-20 DIAGNOSIS — Z1231 Encounter for screening mammogram for malignant neoplasm of breast: Secondary | ICD-10-CM

## 2024-02-25 DIAGNOSIS — E1129 Type 2 diabetes mellitus with other diabetic kidney complication: Secondary | ICD-10-CM | POA: Diagnosis not present

## 2024-02-25 DIAGNOSIS — E7800 Pure hypercholesterolemia, unspecified: Secondary | ICD-10-CM | POA: Diagnosis not present

## 2024-02-25 DIAGNOSIS — Z79899 Other long term (current) drug therapy: Secondary | ICD-10-CM | POA: Diagnosis not present

## 2024-02-25 DIAGNOSIS — R143 Flatulence: Secondary | ICD-10-CM | POA: Diagnosis not present

## 2024-02-25 DIAGNOSIS — R809 Proteinuria, unspecified: Secondary | ICD-10-CM | POA: Diagnosis not present

## 2024-03-12 ENCOUNTER — Encounter (HOSPITAL_BASED_OUTPATIENT_CLINIC_OR_DEPARTMENT_OTHER): Payer: Self-pay

## 2024-03-12 ENCOUNTER — Encounter (HOSPITAL_BASED_OUTPATIENT_CLINIC_OR_DEPARTMENT_OTHER): Payer: Self-pay | Admitting: Cardiovascular Disease

## 2024-03-12 ENCOUNTER — Ambulatory Visit (HOSPITAL_BASED_OUTPATIENT_CLINIC_OR_DEPARTMENT_OTHER): Admitting: Cardiovascular Disease

## 2024-03-12 VITALS — BP 124/78 | HR 85 | Ht 62.0 in | Wt 129.2 lb

## 2024-03-12 DIAGNOSIS — R809 Proteinuria, unspecified: Secondary | ICD-10-CM | POA: Diagnosis not present

## 2024-03-12 DIAGNOSIS — Z5181 Encounter for therapeutic drug level monitoring: Secondary | ICD-10-CM

## 2024-03-12 DIAGNOSIS — I10 Essential (primary) hypertension: Secondary | ICD-10-CM

## 2024-03-12 DIAGNOSIS — E7800 Pure hypercholesterolemia, unspecified: Secondary | ICD-10-CM | POA: Diagnosis not present

## 2024-03-12 DIAGNOSIS — I2510 Atherosclerotic heart disease of native coronary artery without angina pectoris: Secondary | ICD-10-CM

## 2024-03-12 DIAGNOSIS — I6523 Occlusion and stenosis of bilateral carotid arteries: Secondary | ICD-10-CM | POA: Diagnosis not present
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
## 2024-03-12 NOTE — Patient Instructions (Addendum)
 Medication Instructions:  Your physician recommends that you continue on your current medications as directed. Please refer to the Current Medication list given to you today.   *If you need a refill on your cardiac medications before your next appointment, please call your pharmacy*  Lab Work: FASTING LP/CMET FIRST OF THE YEAR   If you have labs (blood work) drawn today and your tests are completely normal, you will receive your results only by: MyChart Message (if you have MyChart) OR A paper copy in the mail If you have any lab test that is abnormal or we need to change your treatment, we will call you to review the results.  Testing/Procedures: NONE  Follow-Up: At [facility], you and your health needs are our priority.  As part of our continuing mission to provide you with exceptional heart care, our providers are all part of one team.  This team includes your primary Cardiologist (physician) and Advanced Practice Providers or APPs (Physician Assistants and Nurse Practitioners) who all work together to provide you with the care you need, when you need it.  Your next appointment:   12 month(s)  Provider:   Annabella Scarce, MD, Rosaline Bane, NP, or Reche Finder, NP    You have been referred to [facility] WITH NEPHROLOGY   We recommend signing up for the patient portal called MyChart.  Sign up information is provided on this After Visit Summary.  MyChart is used to connect with patients for Virtual Visits (Telemedicine).  Patients are able to view lab/test results, encounter notes, upcoming appointments, etc.  Non-urgent messages can be sent to your provider as well.   To learn more about what you can do with MyChart, go to [email or web address].

## 2024-03-23 ENCOUNTER — Other Ambulatory Visit: Payer: Self-pay | Admitting: Internal Medicine

## 2024-04-22 ENCOUNTER — Ambulatory Visit: Admitting: Internal Medicine
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
## 2024-05-23 ENCOUNTER — Encounter: Payer: Self-pay | Admitting: Internal Medicine

## 2024-05-23 ENCOUNTER — Ambulatory Visit: Admitting: Internal Medicine

## 2024-05-23 VITALS — BP 130/80 | HR 84 | Ht 62.0 in | Wt 126.8 lb

## 2024-05-23 DIAGNOSIS — Z7984 Long term (current) use of oral hypoglycemic drugs: Secondary | ICD-10-CM | POA: Diagnosis not present

## 2024-05-23 DIAGNOSIS — Z7985 Long-term (current) use of injectable non-insulin antidiabetic drugs: Secondary | ICD-10-CM | POA: Diagnosis not present

## 2024-05-23 DIAGNOSIS — R809 Proteinuria, unspecified: Secondary | ICD-10-CM | POA: Diagnosis not present

## 2024-05-23 DIAGNOSIS — E785 Hyperlipidemia, unspecified: Secondary | ICD-10-CM | POA: Diagnosis not present

## 2024-05-23 DIAGNOSIS — E1165 Type 2 diabetes mellitus with hyperglycemia: Secondary | ICD-10-CM | POA: Diagnosis not present

## 2024-05-23 LAB — POCT GLYCOSYLATED HEMOGLOBIN (HGB A1C): Hemoglobin A1C: 8.5 % — AB (ref 4.0–5.6)

## 2024-05-23 MED ORDER — ACCU-CHEK GUIDE W/DEVICE KIT: PACK | 0 refills | Status: AC

## 2024-05-23 MED ORDER — ACCU-CHEK SOFTCLIX LANCETS MISC: 3 refills | Status: AC

## 2024-05-23 MED ORDER — GLUCOSE BLOOD VI STRP: ORAL_STRIP | 3 refills | Status: AC

## 2024-05-23 NOTE — Addendum Note (Signed)
 Addended by: CLEOTILDE ROLIN RAMAN on: 05/23/2024 09:51 AM   Modules accepted: Orders

## 2024-05-23 NOTE — Patient Instructions (Addendum)
 Please continue: - Ozempic  1 mg weekly  - Glipizide  XL 2.5 mg 2x a day before meals.  Check sugars 1x a day, rotating check times.   Please return in 3-4 months with your sugar log.

## 2024-06-04 ENCOUNTER — Telehealth: Payer: Self-pay | Admitting: Cardiovascular Disease

## 2024-06-04 NOTE — Telephone Encounter (Signed)
 Patient is following up regarding Nephrology referral to Dr. Dennise. She says their office didn't receive referral. Please advise.

## 2024-06-04 NOTE — Telephone Encounter (Signed)
 Spoke with patient regarding referral   Left message for Nephrology office to call back, ? Referral received

## 2024-06-12 ENCOUNTER — Encounter (HOSPITAL_BASED_OUTPATIENT_CLINIC_OR_DEPARTMENT_OTHER): Payer: Self-pay

## 2024-09-22 ENCOUNTER — Ambulatory Visit: Admitting: Internal Medicine
